# Patient Record
Sex: Male | Born: 1985 | Race: White | Hispanic: No | Marital: Single | Smoking: Former smoker
Health system: Southern US, Community
[De-identification: ages and names within clinical notes are randomized; demographics above are authoritative.]

## PROBLEM LIST (undated history)

## (undated) DIAGNOSIS — G4730 Sleep apnea, unspecified: Secondary | ICD-10-CM

## (undated) DIAGNOSIS — R7989 Other specified abnormal findings of blood chemistry: Secondary | ICD-10-CM

## (undated) DIAGNOSIS — I10 Essential (primary) hypertension: Secondary | ICD-10-CM

## (undated) DIAGNOSIS — Z8489 Family history of other specified conditions: Secondary | ICD-10-CM

## (undated) DIAGNOSIS — Z9189 Other specified personal risk factors, not elsewhere classified: Secondary | ICD-10-CM

## (undated) DIAGNOSIS — F419 Anxiety disorder, unspecified: Secondary | ICD-10-CM

## (undated) DIAGNOSIS — K219 Gastro-esophageal reflux disease without esophagitis: Secondary | ICD-10-CM

## (undated) DIAGNOSIS — E785 Hyperlipidemia, unspecified: Secondary | ICD-10-CM

## (undated) DIAGNOSIS — F32A Depression, unspecified: Secondary | ICD-10-CM

## (undated) DIAGNOSIS — R7303 Prediabetes: Secondary | ICD-10-CM

## (undated) HISTORY — DX: Morbid (severe) obesity due to excess calories: E66.01

## (undated) HISTORY — PX: TONSILLECTOMY AND ADENOIDECTOMY: SUR1326

## (undated) HISTORY — DX: Essential (primary) hypertension: I10

## (undated) HISTORY — DX: Hyperlipidemia, unspecified: E78.5

## (undated) HISTORY — DX: Anxiety disorder, unspecified: F41.9

## (undated) HISTORY — PX: WISDOM TOOTH EXTRACTION: SHX21

## (undated) HISTORY — DX: Gastro-esophageal reflux disease without esophagitis: K21.9

## (undated) HISTORY — DX: Depression, unspecified: F32.A

## (undated) HISTORY — DX: Other specified personal risk factors, not elsewhere classified: Z91.89

## (undated) HISTORY — DX: Other specified abnormal findings of blood chemistry: R79.89

---

## 2019-10-15 ENCOUNTER — Encounter: Payer: Self-pay | Admitting: Family Medicine

## 2019-10-15 ENCOUNTER — Ambulatory Visit (INDEPENDENT_AMBULATORY_CARE_PROVIDER_SITE_OTHER): Payer: PRIVATE HEALTH INSURANCE | Admitting: Family Medicine

## 2019-10-15 ENCOUNTER — Other Ambulatory Visit: Payer: Self-pay

## 2019-10-15 VITALS — BP 130/98 | HR 105 | Ht 69.0 in | Wt 319.6 lb

## 2019-10-15 DIAGNOSIS — F419 Anxiety disorder, unspecified: Secondary | ICD-10-CM

## 2019-10-15 DIAGNOSIS — R7989 Other specified abnormal findings of blood chemistry: Secondary | ICD-10-CM | POA: Diagnosis not present

## 2019-10-15 DIAGNOSIS — E785 Hyperlipidemia, unspecified: Secondary | ICD-10-CM

## 2019-10-15 DIAGNOSIS — F32A Depression, unspecified: Secondary | ICD-10-CM | POA: Insufficient documentation

## 2019-10-15 DIAGNOSIS — Z9189 Other specified personal risk factors, not elsewhere classified: Secondary | ICD-10-CM | POA: Insufficient documentation

## 2019-10-15 DIAGNOSIS — E7800 Pure hypercholesterolemia, unspecified: Secondary | ICD-10-CM | POA: Insufficient documentation

## 2019-10-15 DIAGNOSIS — R350 Frequency of micturition: Secondary | ICD-10-CM

## 2019-10-15 DIAGNOSIS — K219 Gastro-esophageal reflux disease without esophagitis: Secondary | ICD-10-CM | POA: Insufficient documentation

## 2019-10-15 DIAGNOSIS — E661 Drug-induced obesity: Secondary | ICD-10-CM | POA: Insufficient documentation

## 2019-10-15 DIAGNOSIS — I10 Essential (primary) hypertension: Secondary | ICD-10-CM | POA: Insufficient documentation

## 2019-10-15 DIAGNOSIS — Z833 Family history of diabetes mellitus: Secondary | ICD-10-CM

## 2019-10-15 DIAGNOSIS — F329 Major depressive disorder, single episode, unspecified: Secondary | ICD-10-CM

## 2019-10-15 DIAGNOSIS — R0683 Snoring: Secondary | ICD-10-CM

## 2019-10-15 LAB — POCT URINALYSIS DIP (PROADVANTAGE DEVICE)
Bilirubin, UA: NEGATIVE
Blood, UA: NEGATIVE
Glucose, UA: NEGATIVE mg/dL
Ketones, POC UA: NEGATIVE mg/dL
Leukocytes, UA: NEGATIVE
Nitrite, UA: NEGATIVE
Protein Ur, POC: NEGATIVE mg/dL
Specific Gravity, Urine: 1.025
Urobilinogen, Ur: NEGATIVE
pH, UA: 6 (ref 5.0–8.0)

## 2019-10-15 MED ORDER — LOSARTAN POTASSIUM-HCTZ 100-25 MG PO TABS: 1.0000 | ORAL_TABLET | Freq: Every day | ORAL | 2 refills | Status: DC

## 2019-10-15 MED ORDER — BUPROPION HCL ER (XL) 300 MG PO TB24: 300.0000 mg | ORAL_TABLET | Freq: Every day | ORAL | 2 refills | Status: DC

## 2019-10-15 MED ORDER — METOPROLOL TARTRATE 25 MG PO TABS: 25.0000 mg | ORAL_TABLET | Freq: Two times a day (BID) | ORAL | 2 refills | Status: DC

## 2019-10-15 MED ORDER — BUSPIRONE HCL 15 MG PO TABS: 7.5000 mg | ORAL_TABLET | Freq: Two times a day (BID) | ORAL | 2 refills | Status: DC

## 2019-10-15 MED ORDER — AMLODIPINE BESYLATE 10 MG PO TABS: 10.0000 mg | ORAL_TABLET | Freq: Every day | ORAL | 2 refills | Status: DC

## 2019-10-15 NOTE — Progress Notes (Signed)
Subjective:    Patient ID: Kurt Gomez, male    DOB: 03-14-1985, 34 y.o.   MRN: [id number]  HPI Chief Complaint  Patient presents with   new pt    new pt get estbalished. bp issues.    He is new to the practice and here to establish care.  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  Completed PhD   Single and no children.   HTN- diagnosed in 2015 and has been on 4 HTN medications recently. States his BP has still been elevated at home.  Taking Hyzaar 100-25, amlodipine 10 mg and metoprolol tartrate 25 mg States HTN runs in his family as well as diabetes.   He is concerned about his weight and is considering weight loss surgery with [facility]. States he needs help with weight loss.   Hx of elevated liver function tests.   Elevated lipids and states he was not started on a statin due to elevated liver tests.   Anxiety and depression- taking Wellbutrin and Buspar for the past year and half. States he is ready to see a therapist.   GERD- sleeps with his head elevated. Takes omeprazole daily   No alcohol.  Stopped smoking in July 2021. Smoked 1/2 ppd for 10 years.  No drug use.   Snores. States his mother thinks he may have sleep apnea. Wakes up gasping for air at times.  He feels rested but later in the day he feels tired.  Wakes up several times with nocturia.   Mother is a Engineer, civil (consulting).   Denies fever, chills, dizziness, chest pain, palpitations, shortness of breath, abdominal pain, N/V/D, or LE edema.   Reviewed allergies, medications, past medical, surgical, family, and social history.    Review of Systems Pertinent positives and negatives in the history of present illness.     Objective:   Physical Exam Constitutional:      General: He is not in acute distress.    Appearance: Normal appearance. He is not ill-appearing.  Cardiovascular:     Rate and Rhythm:  Normal rate and regular rhythm.     Pulses: Normal pulses.     Heart sounds: Normal heart sounds.  Pulmonary:     Effort: Pulmonary effort is normal.     Breath sounds: Normal breath sounds.  Musculoskeletal:     Cervical back: Normal range of motion and neck supple.  Skin:    General: Skin is warm and dry.     Capillary Refill: Capillary refill takes less than 2 seconds.  Neurological:     General: No focal deficit present.     Mental Status: He is alert.     Coordination: Coordination normal.     Deep Tendon Reflexes: Reflexes normal.  Psychiatric:        Mood and Affect: Mood normal.        Thought Content: Thought content normal.        Judgment: Judgment normal.    BP (!) 130/98    Pulse (!) 105    Ht 5\' 9"  (1.753 m)    Wt (!) 319 lb 9.6 oz (145 kg)    SpO2 98%    BMI 47.20 kg/m       Assessment & Plan:  Essential hypertension - Plan: Amb Ref to Medical Weight Management, CBC with Differential/Platelet, Comprehensive metabolic panel, EKG 12-Lead -He is a pleasant 34 year old male who is  new to the practice and here to establish care. Diagnosed with hypertension approximately 6 years ago and blood pressure is reportedly never been under very good control.  He is currently on 4 different hypertensive medications.  Counseling on healthy diet, specifically low-sodium diet. DASH diet handout provided. Discussed how weight loss could help with lowering BP. Discussed that he may have sleep apnea and if so, treating this can lower BP.  I will refer him to [facility] weight management for help with weight loss and order a HST.  He will follow up with me in 4 weeks and bring in his BP machine and readings.  UA shows trace protein. Recheck at his follow up visit.   Elevated liver function tests - Plan: Amb Ref to Medical Weight Management -obesity contributing. Follow up pending LFTs   Hyperlipidemia, unspecified hyperlipidemia type - Plan: Amb Ref to Medical Weight Management, Lipid  panel, EKG 12-Lead -history of HL and has not been on statin therapy per patient due to elevated LFTs  Anxiety and depression - Plan: TSH, T4, free, T3 -reports doing well on current treatment regimen. Recommend counseling. I am happy to provide him with a list of therapists if he would like   At risk for sleep apnea - Plan: Home sleep test -suspect he has sleep apnea and I will order a HST. Discussed health consequences of untreated OSA if he does have this.   Morbid obesity (HCC) - Plan: Amb Ref to Medical Weight Management, TSH, T4, free, T3, Hemoglobin A1c -he is motivated to lose weight. I will refer him to [facility]. Check labs and follow up.   Gastroesophageal reflux disease, unspecified whether esophagitis present -continue PPI and lifestyle modification   Urinary frequency - Plan: POCT Urinalysis DIP (Proadvantage Device) -UA neg for infection or glucose  Family history of diabetes mellitus in father - Plan: Hemoglobin A1c -screen for DM and follow up  Snoring - Plan: Home sleep test

## 2019-10-15 NOTE — Patient Instructions (Addendum)
You will hear from [facility] regarding you home sleep test to screen for sleep apnea.   You will also hear from [facility] to schedule a visit.   I will be in touch with your lab results.   Keep a close eye on your blood pressure at home. Goal BP readings are <130/80.  Cut back on sodium intake.   Return in 4 -6 weeks with your BP machine and readings.    DASH Eating Plan DASH stands for "Dietary Approaches to Stop Hypertension." The DASH eating plan is a healthy eating plan that has been shown to reduce high blood pressure (hypertension). It may also reduce your risk for type 2 diabetes, heart disease, and stroke. The DASH eating plan may also help with weight loss. What are tips for following this plan?  General guidelines  Avoid eating more than 2,300 mg (milligrams) of salt (sodium) a day. If you have hypertension, you may need to reduce your sodium intake to 1,500 mg a day.  Limit alcohol intake to no more than 1 drink a day for nonpregnant women and 2 drinks a day for men. One drink equals 12 oz of beer, 5 oz of wine, or 1 oz of hard liquor.  Work with your health care provider to maintain a healthy body weight or to lose weight. Ask what an ideal weight is for you.  Get at least 30 minutes of exercise that causes your heart to beat faster (aerobic exercise) most days of the week. Activities may include walking, swimming, or biking.  Work with your health care provider or diet and nutrition specialist (dietitian) to adjust your eating plan to your individual calorie needs. Reading food labels   Check food labels for the amount of sodium per serving. Choose foods with less than 5 percent of the Daily Value of sodium. Generally, foods with less than 300 mg of sodium per serving fit into this eating plan.  To find whole grains, look for the word "whole" as the first word in the ingredient list. Shopping  Buy products labeled as "low-sodium" or  "no salt added."  Buy fresh foods. Avoid canned foods and premade or frozen meals. Cooking  Avoid adding salt when cooking. Use salt-free seasonings or herbs instead of table salt or sea salt. Check with your health care provider or pharmacist before using salt substitutes.  Do not fry foods. Pierre foods using healthy methods such as baking, boiling, grilling, and broiling instead.  Rupert with heart-healthy oils, such as olive, canola, soybean, or sunflower oil. Meal planning  Eat a balanced diet that includes: ? 5 or more servings of fruits and vegetables each day. At each meal, try to fill half of your plate with fruits and vegetables. ? Up to 6-8 servings of whole grains each day. ? Less than 6 oz of lean meat, poultry, or fish each day. A 3-oz serving of meat is about the same size as a deck of cards. One egg equals 1 oz. ? 2 servings of low-fat dairy each day. ? A serving of nuts, seeds, or beans 5 times each week. ? Heart-healthy fats. Healthy fats called Omega-3 fatty acids are found in foods such as flaxseeds and coldwater fish, like sardines, salmon, and mackerel.  Limit how much you eat of the following: ? Canned or prepackaged foods. ? Food that is high in trans fat, such as fried foods. ? Food that is high in saturated fat, such as fatty meat. ?  Sweets, desserts, sugary drinks, and other foods with added sugar. ? Full-fat dairy products.  Do not salt foods before eating.  Try to eat at least 2 vegetarian meals each week.  Eat more home-cooked food and less restaurant, buffet, and fast food.  When eating at a restaurant, ask that your food be prepared with less salt or no salt, if possible. What foods are recommended? The items listed may not be a complete list. Talk with your dietitian about what dietary choices are best for you. Grains Whole-grain or whole-wheat bread. Whole-grain or whole-wheat pasta. Brown rice. [place]. Bulgur. Whole-grain and low-sodium  cereals. Pita bread. Low-fat, low-sodium crackers. Whole-wheat flour tortillas. Vegetables Fresh or frozen vegetables (raw, steamed, roasted, or grilled). Low-sodium or reduced-sodium tomato and vegetable juice. Low-sodium or reduced-sodium tomato sauce and tomato paste. Low-sodium or reduced-sodium canned vegetables. Fruits All fresh, dried, or frozen fruit. Canned fruit in natural juice (without added sugar). Meat and other protein foods Skinless chicken or [place]. Ground chicken or [place]. Pork with fat trimmed off. Fish and seafood. Egg whites. Dried beans, peas, or lentils. Unsalted nuts, nut butters, and seeds. Unsalted canned beans. Lean cuts of beef with fat trimmed off. Low-sodium, lean deli meat. Dairy Low-fat (1%) or fat-free (skim) milk. Fat-free, low-fat, or reduced-fat cheeses. Nonfat, low-sodium ricotta or cottage cheese. Low-fat or nonfat yogurt. Low-fat, low-sodium cheese. Fats and oils Soft margarine without trans fats. Vegetable oil. Low-fat, reduced-fat, or light mayonnaise and salad dressings (reduced-sodium). Canola, safflower, olive, soybean, and sunflower oils. Avocado. Seasoning and other foods Herbs. Spices. Seasoning mixes without salt. Unsalted popcorn and pretzels. Fat-free sweets. What foods are not recommended? The items listed may not be a complete list. Talk with your dietitian about what dietary choices are best for you. Grains Baked goods made with fat, such as croissants, muffins, or some breads. Dry pasta or rice meal packs. Vegetables Creamed or fried vegetables. Vegetables in a cheese sauce. Regular canned vegetables (not low-sodium or reduced-sodium). Regular canned tomato sauce and paste (not low-sodium or reduced-sodium). Regular tomato and vegetable juice (not low-sodium or reduced-sodium). Angie Fava. Olives. Fruits Canned fruit in a light or heavy syrup. Fried fruit. Fruit in cream or butter sauce. Meat and other protein foods Fatty cuts of meat. Ribs.  Fried meat. Berniece Salines. Sausage. Bologna and other processed lunch meats. Salami. Fatback. Hotdogs. Bratwurst. Salted nuts and seeds. Canned beans with added salt. Canned or smoked fish. Whole eggs or egg yolks. Chicken or [place] with skin. Dairy Whole or 2% milk, cream, and half-and-half. Whole or full-fat cream cheese. Whole-fat or sweetened yogurt. Full-fat cheese. Nondairy creamers. Whipped toppings. Processed cheese and cheese spreads. Fats and oils Butter. Stick margarine. Lard. Shortening. Ghee. Bacon fat. Tropical oils, such as coconut, palm kernel, or palm oil. Seasoning and other foods Salted popcorn and pretzels. Onion salt, garlic salt, seasoned salt, table salt, and sea salt. Worcestershire sauce. Tartar sauce. Barbecue sauce. Teriyaki sauce. Soy sauce, including reduced-sodium. Steak sauce. Canned and packaged gravies. Fish sauce. Oyster sauce. Cocktail sauce. Horseradish that you find on the shelf. Ketchup. Mustard. Meat flavorings and tenderizers. Bouillon cubes. Hot sauce and Tabasco sauce. Premade or packaged marinades. Premade or packaged taco seasonings. Relishes. Regular salad dressings. Where to find more information:  National Heart, Lung, and [place]: https://wilson-eaton.com/  American Heart Association: www.heart.org Summary  The DASH eating plan is a healthy eating plan that has been shown to reduce high blood pressure (hypertension). It may also reduce your risk for type 2 diabetes,  heart disease, and stroke.  With the DASH eating plan, you should limit salt (sodium) intake to 2,300 mg a day. If you have hypertension, you may need to reduce your sodium intake to 1,500 mg a day.  When on the DASH eating plan, aim to eat more fresh fruits and vegetables, whole grains, lean proteins, low-fat dairy, and heart-healthy fats.  Work with your health care provider or diet and nutrition specialist (dietitian) to adjust your eating plan to your individual calorie needs. This  information is not intended to replace advice given to you by your health care provider. Make sure you discuss any questions you have with your health care provider. Document Revised: 12/30/2016 Document Reviewed: 01/11/2016 Elsevier Patient Education  2020 [employer].

## 2019-10-16 LAB — COMPREHENSIVE METABOLIC PANEL
ALT: 56 IU/L — ABNORMAL HIGH (ref 0–44)
AST: 25 IU/L (ref 0–40)
Albumin/Globulin Ratio: 1.4 (ref 1.2–2.2)
Albumin: 4.5 g/dL (ref 4.0–5.0)
Alkaline Phosphatase: 106 IU/L (ref 44–121)
BUN/Creatinine Ratio: 10 (ref 9–20)
BUN: 13 mg/dL (ref 6–20)
Bilirubin Total: <0.2 mg/dL (ref 0.0–1.2)
CO2: 27 mmol/L (ref 20–29)
Calcium: 10.4 mg/dL — ABNORMAL HIGH (ref 8.7–10.2)
Chloride: 99 mmol/L (ref 96–106)
Creatinine, Ser: 1.28 mg/dL — ABNORMAL HIGH (ref 0.76–1.27)
GFR calc Af Amer: 86 mL/min/{1.73_m2} (ref >59)
GFR calc non Af Amer: 74 mL/min/{1.73_m2} (ref >59)
Globulin, Total: 3.3 g/dL (ref 1.5–4.5)
Glucose: 99 mg/dL (ref 65–99)
Potassium: 4.1 mmol/L (ref 3.5–5.2)
Sodium: 140 mmol/L (ref 134–144)
Total Protein: 7.8 g/dL (ref 6.0–8.5)

## 2019-10-16 LAB — TSH: TSH: 2.4 u[IU]/mL (ref 0.450–4.500)

## 2019-10-16 LAB — CBC WITH DIFFERENTIAL/PLATELET
Basophils Absolute: 0.1 10*3/uL (ref 0.0–0.2)
Basos: 1 %
EOS (ABSOLUTE): 0.2 10*3/uL (ref 0.0–0.4)
Eos: 2 %
Hematocrit: 44.9 % (ref 37.5–51.0)
Hemoglobin: 14.8 g/dL (ref 13.0–17.7)
Immature Grans (Abs): 0 10*3/uL (ref 0.0–0.1)
Immature Granulocytes: 0 %
Lymphocytes Absolute: 2.9 10*3/uL (ref 0.7–3.1)
Lymphs: 32 %
MCH: 26.8 pg (ref 26.6–33.0)
MCHC: 33 g/dL (ref 31.5–35.7)
MCV: 81 fL (ref 79–97)
Monocytes Absolute: 0.9 10*3/uL (ref 0.1–0.9)
Monocytes: 10 %
Neutrophils Absolute: 5 10*3/uL (ref 1.4–7.0)
Neutrophils: 55 %
Platelets: 456 10*3/uL — ABNORMAL HIGH (ref 150–450)
RBC: 5.53 x10E6/uL (ref 4.14–5.80)
RDW: 14.5 % (ref 11.6–15.4)
WBC: 9 10*3/uL (ref 3.4–10.8)

## 2019-10-16 LAB — HEMOGLOBIN A1C
Est. average glucose Bld gHb Est-mCnc: 117 mg/dL
Hgb A1c MFr Bld: 5.7 % — ABNORMAL HIGH (ref 4.8–5.6)

## 2019-10-16 LAB — LIPID PANEL
Chol/HDL Ratio: 5.7 ratio — ABNORMAL HIGH (ref 0.0–5.0)
Cholesterol, Total: 229 mg/dL — ABNORMAL HIGH (ref 100–199)
HDL: 40 mg/dL (ref >39)
LDL Chol Calc (NIH): 138 mg/dL — ABNORMAL HIGH (ref 0–99)
Triglycerides: 285 mg/dL — ABNORMAL HIGH (ref 0–149)
VLDL Cholesterol Cal: 51 mg/dL — ABNORMAL HIGH (ref 5–40)

## 2019-10-16 LAB — T4, FREE: Free T4: 1.21 ng/dL (ref 0.82–1.77)

## 2019-10-16 LAB — T3: T3, Total: 138 ng/dL (ref 71–180)

## 2019-10-16 NOTE — Progress Notes (Signed)
Please ask maria to add an acute hepatitis panel due to elevated ALT.  Let him know we are checking this panel due to a mildly elevated liver enzyme. Also, he does have prediabetes with a Hgb A1c of 5.7% (prediabetes range is 5.7-6.4% and diabetes starts at 6.5%). his LDL or bad cholesterol is elevated at 138 and goal range is less than 100. I recommend cutting back on fried foods, fatty foods and increasing his physical activity as tolerated.

## 2019-10-17 LAB — SPECIMEN STATUS REPORT

## 2019-10-17 LAB — HEPATITIS PANEL, ACUTE
Hep A IgM: NEGATIVE
Hep B C IgM: NEGATIVE
Hep C Virus Ab: <0.1 s/co ratio (ref 0.0–0.9)
Hepatitis B Surface Ag: NEGATIVE

## 2019-10-21 ENCOUNTER — Encounter: Payer: Self-pay | Admitting: Family Medicine

## 2019-11-07 ENCOUNTER — Telehealth: Payer: Self-pay | Admitting: Family Medicine

## 2019-11-07 NOTE — Telephone Encounter (Signed)
Received requested records from [facility]

## 2019-11-11 ENCOUNTER — Other Ambulatory Visit: Payer: Self-pay | Admitting: Surgery

## 2019-11-11 ENCOUNTER — Other Ambulatory Visit (HOSPITAL_COMMUNITY): Payer: Self-pay | Admitting: Surgery

## 2019-11-14 ENCOUNTER — Other Ambulatory Visit: Payer: Self-pay

## 2019-11-14 ENCOUNTER — Ambulatory Visit (INDEPENDENT_AMBULATORY_CARE_PROVIDER_SITE_OTHER): Payer: PRIVATE HEALTH INSURANCE | Admitting: Family Medicine

## 2019-11-14 ENCOUNTER — Encounter: Payer: Self-pay | Admitting: Family Medicine

## 2019-11-14 VITALS — BP 138/90 | HR 96 | Ht 70.0 in | Wt 319.8 lb

## 2019-11-14 DIAGNOSIS — R7989 Other specified abnormal findings of blood chemistry: Secondary | ICD-10-CM

## 2019-11-14 DIAGNOSIS — N189 Chronic kidney disease, unspecified: Secondary | ICD-10-CM

## 2019-11-14 DIAGNOSIS — E785 Hyperlipidemia, unspecified: Secondary | ICD-10-CM

## 2019-11-14 DIAGNOSIS — Z9189 Other specified personal risk factors, not elsewhere classified: Secondary | ICD-10-CM

## 2019-11-14 DIAGNOSIS — F419 Anxiety disorder, unspecified: Secondary | ICD-10-CM

## 2019-11-14 DIAGNOSIS — I10 Essential (primary) hypertension: Secondary | ICD-10-CM

## 2019-11-14 DIAGNOSIS — F32A Depression, unspecified: Secondary | ICD-10-CM

## 2019-11-14 DIAGNOSIS — R7303 Prediabetes: Secondary | ICD-10-CM | POA: Insufficient documentation

## 2019-11-14 HISTORY — DX: Essential (primary) hypertension: I10

## 2019-11-14 NOTE — Progress Notes (Signed)
Established patient visit   Patient: Kurt Gomez   DOB: [birth year]   33 y.o. Male  MRN: [id number] Visit Date: 11/14/2019  Today's healthcare provider: Hetty Blend, NP-C   Chief Complaint  Patient presents with   Hypertension   Anxiety   Depression   Subjective    HPI  Hypertension, follow-up  BP Readings from Last 3 Encounters:  11/14/19 138/90  10/15/19 (!) 130/98   Wt Readings from Last 3 Encounters:  11/14/19 (!) 319 lb 12.8 oz (145.1 kg)  10/15/19 (!) 319 lb 9.6 oz (145 kg)     He was last seen for hypertension 1 months ago.  BP at that visit was 130/98. Management since that visit includes continue current medication.  Review of records from previous medical records show that his BP has been uncontrolled as far back as 2017.   He reports good compliance with treatment. He is not having side effects.  He is following a Regular diet. He is not exercising. He does not smoke.  Outside blood pressures are arranging around 130's/90's.  Suspect he has sleep apnea but he has not yet heard from the sleep center. We will call and try to get this expedited.   He has paperwork from [facility] that he would like me to fill out. He is attempting to become eligible for weight loss surgery. He needs me to write a letter of necessity.    Symptoms: No chest pain No chest pressure  No palpitations No syncope  No dyspnea No orthopnea  No paroxysmal nocturnal dyspnea No lower extremity edema   Pertinent labs: Lab Results  Component Value Date   CHOL 229 (H) 10/15/2019   HDL 40 10/15/2019   LDLCALC 138 (H) 10/15/2019   TRIG 285 (H) 10/15/2019   CHOLHDL 5.7 (H) 10/15/2019   Lab Results  Component Value Date   NA 140 10/15/2019   K 4.1 10/15/2019   CREATININE 1.28 (H) 10/15/2019   GFRNONAA 74 10/15/2019   GFRAA 86 10/15/2019   GLUCOSE 99 10/15/2019     The ASCVD Risk score (Goff DC Jr., et al., 2013) failed to calculate for the following reasons:   The 2013  ASCVD risk score is only valid for ages 67 to 84   ---------------------------------------------------------------------------------------------------  Anxiety and Depression, Follow-up  He was last seen for anxiety and depression 1 months ago. Changes made at last visit include continue current medication.   He reports good compliance with treatment. He reports good tolerance of treatment. He is not having side effects.   He feels his anxiety and depression is mild and Unchanged since last visit.  Symptoms: No chest pain Yes difficulty concentrating  No dizziness No fatigue  No feelings of losing control Yes insomnia  No irritable No palpitations  No panic attacks No racing thoughts  No shortness of breath No sweating  No tremors/shakes    GAD-7 Results GAD-7 Generalized Anxiety Disorder Screening Tool 11/14/2019  1. Feeling Nervous, Anxious, or on Edge 1  2. Not Being Able to Stop or Control Worrying 1  3. Worrying Too Much About Different Things 1  4. Trouble Relaxing 2  5. Being So Restless it's Hard To Sit Still 1  6. Becoming Easily Annoyed or Irritable 0  7. Feeling Afraid As If Something Awful Might Happen 0  Total GAD-7 Score 6  Difficulty At Work, Home, or Getting  Along With Others? Somewhat difficult    PHQ-9 Scores PHQ9 SCORE ONLY 10/15/2019  PHQ-9  Total Score 0    ---------------------------------------------------------------------------------------------------    Medications: Outpatient Medications Prior to Visit  Medication Sig   amLODipine (NORVASC) 10 MG tablet Take 1 tablet (10 mg total) by mouth daily.   buPROPion (WELLBUTRIN XL) 300 MG 24 hr tablet Take 1 tablet (300 mg total) by mouth daily.   busPIRone (BUSPAR) 15 MG tablet Take 0.5 tablets (7.5 mg total) by mouth 2 (two) times daily.   losartan-hydrochlorothiazide (HYZAAR) 100-25 MG tablet Take 1 tablet by mouth daily.   metoprolol tartrate (LOPRESSOR) 25 MG tablet Take 1 tablet (25 mg  total) by mouth 2 (two) times daily.   No facility-administered medications prior to visit.    Review of Systems Pertinent positives and negatives in the history of present illness.    Objective    BP 138/90 (BP Location: Right Arm, Patient Position: Sitting, Cuff Size: Normal)    Pulse 96    Ht 5\' 10"  (1.778 m)    Wt (!) 319 lb 12.8 oz (145.1 kg)    SpO2 97%    BMI 45.89 kg/m    Physical Exam Constitutional:      Appearance: Normal appearance. He is obese.  Skin:    General: Skin is warm and dry.  Neurological:     General: No focal deficit present.     Mental Status: He is alert and oriented to person, place, and time.  Psychiatric:        Mood and Affect: Mood normal.        Behavior: Behavior normal.      No results found for any visits on 11/14/19.  Assessment & Plan     Uncontrolled hypertension - Plan: Comprehensive metabolic panel, Ambulatory referral to Cardiology  Anxiety and depression  Hyperlipidemia, unspecified hyperlipidemia type  Morbid obesity (HCC)  At risk for sleep apnea  Elevated liver function tests - Plan: Comprehensive metabolic panel  Chronic kidney disease, unspecified CKD stage - Plan: Comprehensive metabolic panel  Prediabetes  This is his 2nd visit with me. I received records from his previous PCP in 11/16/19. He has had uncontrolled HTN since 2017 and has been on 4 HTN agents.  Suspect he has OSA and I have ordered a HST. Awaiting to be contacted to do this. I will cancel his current referral since he has not heard in one month and switch the referral to [facility]. Discussed this may be contributing to his HTN Plan to refer him to cardiology for further assistance with resistant HTN. Dr. [facility] agrees with plan.  He has been struggling with his weight for years. We reviewed his weights all the way back to 2017 as well as the methods for weight loss. We documented all of this for his application for [facility].  Reviewed abnormal labs from previous  visit including HL, prediabetes, elevated ALTand CKD.  Recheck labs and follow up.  Work on letter of necessity for [facility] and return to him.    2018, NP-C  [facility] [phone] (phone) [phone] (fax)  [facility]

## 2019-11-15 ENCOUNTER — Encounter: Payer: Self-pay | Admitting: Internal Medicine

## 2019-11-15 LAB — COMPREHENSIVE METABOLIC PANEL
ALT: 47 IU/L — ABNORMAL HIGH (ref 0–44)
AST: 28 IU/L (ref 0–40)
Albumin/Globulin Ratio: 1.6 (ref 1.2–2.2)
Albumin: 4.6 g/dL (ref 4.0–5.0)
Alkaline Phosphatase: 103 IU/L (ref 44–121)
BUN/Creatinine Ratio: 14 (ref 9–20)
BUN: 15 mg/dL (ref 6–20)
Bilirubin Total: <0.2 mg/dL (ref 0.0–1.2)
CO2: 24 mmol/L (ref 20–29)
Calcium: 10 mg/dL (ref 8.7–10.2)
Chloride: 99 mmol/L (ref 96–106)
Creatinine, Ser: 1.09 mg/dL (ref 0.76–1.27)
GFR calc Af Amer: 104 mL/min/{1.73_m2} (ref >59)
GFR calc non Af Amer: 90 mL/min/{1.73_m2} (ref >59)
Globulin, Total: 2.9 g/dL (ref 1.5–4.5)
Glucose: 104 mg/dL — ABNORMAL HIGH (ref 65–99)
Potassium: 4.4 mmol/L (ref 3.5–5.2)
Sodium: 141 mmol/L (ref 134–144)
Total Protein: 7.5 g/dL (ref 6.0–8.5)

## 2019-12-02 ENCOUNTER — Encounter: Payer: Self-pay | Admitting: Family Medicine

## 2019-12-02 ENCOUNTER — Telehealth: Payer: Self-pay | Admitting: Internal Medicine

## 2019-12-02 ENCOUNTER — Other Ambulatory Visit: Payer: Self-pay | Admitting: Internal Medicine

## 2019-12-02 DIAGNOSIS — G4733 Obstructive sleep apnea (adult) (pediatric): Secondary | ICD-10-CM

## 2019-12-02 NOTE — Telephone Encounter (Signed)
Pt has severe sleep apnea, evidence of significant oxygen desaturation. I have put in order for cpap machine to [facility]. Left message for pt to call me back

## 2019-12-03 ENCOUNTER — Other Ambulatory Visit: Payer: Self-pay

## 2019-12-03 ENCOUNTER — Ambulatory Visit (HOSPITAL_COMMUNITY)
Admission: RE | Admit: 2019-12-03 | Discharge: 2019-12-03 | Disposition: A | Discharge status: Home / Self Care | Payer: PRIVATE HEALTH INSURANCE | Source: Ambulatory Visit | Attending: Surgery | Admitting: Surgery

## 2019-12-05 ENCOUNTER — Other Ambulatory Visit: Payer: Self-pay

## 2019-12-05 ENCOUNTER — Encounter: Payer: Self-pay | Admitting: Dietician

## 2019-12-05 ENCOUNTER — Encounter: Payer: PRIVATE HEALTH INSURANCE | Attending: Surgery | Admitting: Dietician

## 2019-12-05 DIAGNOSIS — E669 Obesity, unspecified: Secondary | ICD-10-CM | POA: Diagnosis not present

## 2019-12-05 NOTE — Progress Notes (Signed)
Nutrition Assessment for Bariatric Surgery Medical Nutrition Therapy   Patient was seen on 12/05/2019 for Pre-Operative Nutrition Assessment. Letter of approval faxed to [facility] bariatric surgery program coordinator on 12/05/2019.   Referral stated Supervised Weight Loss (SWL) visits needed: 6?* *this is patient reported, neither the patient or [facility] have been directly told the patient's SWL visit requirements  Planned surgery: Sleeve Pt expectation of surgery: to lose weight   NUTRITION ASSESSMENT   Anthropometrics  Start weight at [facility]: 318.4 lbs (date: 12/05/2019) Height: 69.5 in BMI: 46.4 kg/m2     Lifestyle & Dietary Hx Patient works as a professor at [employer]. Moved here from [place], currently lives alone. Eats out throughout the week, 5-10 meals. Typical meal pattern is 2 meals per day (breakfast and dinner) and snacking in between. May have noodles with sauce and meat, likes biscuits and toast.  May snack on chips, a granola bar, a muffin, pretzels, or nuts.   Any previous deficiencies: no  Micronutrient Nutrition Focused Physical Exam: Hair: no issues observed Eyes: no issues observed Mouth: no issues observed Neck: no issues observed Nails: no issues observed Skin: no issues observed  24-Hr Dietary Recall First Meal: 3-4 egg ham & cheese omelet Snack: - Second Meal: - Snack: pretzels Third Meal: chicken + dressing Snack: bowl of cerel + milk  Beverages: coffee, water   NUTRITION DIAGNOSIS  Overweight/obesity ([place]-3.3) related to past poor dietary habits and physical inactivity as evidenced by patient w/ planned Sleeve surgery following dietary guidelines for continued weight loss.    NUTRITION INTERVENTION  Nutrition counseling (C-1) and education (E-2) to facilitate bariatric surgery goals.  Pre-Op Goals Reviewed with the Patient . Track food and beverage intake (pen and paper, MyFitness Pal, Baritastic app, etc.) . Make healthy food choices  while monitoring portion sizes . Consume 3 meals per day or try to eat every 3-5 hours . Avoid concentrated sugars and fried foods . Keep sugar & fat in the single digits per serving on food labels . Practice CHEWING your food (aim for applesauce consistency) . Practice not drinking 15 minutes before, during, and 30 minutes after each meal and snack . Avoid all carbonated beverages (ex: soda, sparkling beverages)  . Limit caffeinated beverages (ex: coffee, tea, energy drinks) . Avoid all sugar-sweetened beverages (ex: regular soda, sports drinks)  . Avoid alcohol  . Aim for 64-100 ounces of FLUID daily (with at least half of fluid intake being plain water)  . Aim for at least 60-80 grams of PROTEIN daily . Look for a liquid protein source that contains ?15 g protein and ?5 g carbohydrate (ex: shakes, drinks, shots) . Make a list of non-food related activities . Physical activity is an important part of a healthy lifestyle so keep it moving! The goal is to reach 150 minutes of exercise per week, including cardiovascular and weight baring activity.  *Goals that are bolded indicate the pt would like to start working towards these  Handouts Provided Include  . Bariatric Surgery handouts (Nutrition Visits, Pre-Op Goals, Protein Shakes)  Learning Style & Readiness for Change Teaching method utilized: Visual & Auditory  Demonstrated degree of understanding via: Teach Back  Barriers to learning/adherence to lifestyle change: None Identified    MONITORING & EVALUATION Dietary intake, weekly physical activity, body weight, and pre-op goals reached at next nutrition visit.   Next Steps Patient is to return to [facility] for 1st SWL Visit unless we learn he does not have any SWL requirements. Otherwise  we will have patient return for the Pre-Op Class 2 weeks prior to surgery.

## 2019-12-08 NOTE — Progress Notes (Signed)
Cardiology Office Note:    Date:  12/10/2019   ID:  Kurt Gomez, DOB 21-Apr-1985, MRN [id number]  PCP:  Avanell Shackleton, NP-C  [facility] Cardiologist:  No primary care provider on file.  [facility] Electrophysiologist:  None   Referring MD: Avanell Shackleton, NP-C    History of Present Illness:    Kurt Gomez is a 34 y.o. male with a hx of CKD, HTN, morbid obesity, and HLD who was referred by Hetty Blend, NP-C for further evaluation of HTN.  Patient was initially diagnosed with HTN in 2017. Has been poorly controlled despite use of 4 antihypertensive agents. Was previously followed in [place] where he underwent renal ultrasound which was reportedly normal. Not sure if he had other work-up of his hypertension but believes it was considered genetic as everyone in his family has elevated BP.  Overall, the patient feels well. Has heart burn and GI issues but no chest pain, SOB, orthopnea, or LE edema. Had episodes of apnea and had sleep study done and patient has severe OSA now ordered for CPAP. Hoping to undergo bariatric surgery for obesity.   Blood pressure at home 130s/90s at home on amlodipine 10mg , losartan-HTCZ 100-25mg , metop 25mg  BID. No lightheadedness, dizziness, or syncope.  Family history: mother-HTN, father-HTN, aunts and uncles-HTN (all diagnosed early). Uncles with CAD and MI.   Past Medical History:  Diagnosis Date  . Anxiety and depression   . At risk for sleep apnea   . Elevated liver function tests   . GERD (gastroesophageal reflux disease)   . HTN (hypertension)   . Hyperlipidemia   . Morbid obesity (HCC)     Past Surgical History:  Procedure Laterality Date  . TONSILLECTOMY AND ADENOIDECTOMY      Current Medications: Current Meds  Medication Sig  . amLODipine (NORVASC) 10 MG tablet Take 1 tablet (10 mg total) by mouth daily.  buPROPion (WELLBUTRIN XL) 300 MG 24 hr tablet Take 1 tablet (300 mg total) by mouth daily.  . busPIRone (BUSPAR) 15 MG  tablet Take 0.5 tablets (7.5 mg total) by mouth 2 (two) times daily.  losartan-hydrochlorothiazide (HYZAAR) 100-25 MG tablet Take 1 tablet by mouth daily.  . [DISCONTINUED] metoprolol tartrate (LOPRESSOR) 25 MG tablet Take 1 tablet (25 mg total) by mouth 2 (two) times daily.     Allergies:   Patient has no known allergies.   Social History   Socioeconomic History  . Marital status: Single    Spouse name: Not on file  . Number of children: Not on file  . Years of education: Not on file  . Highest education level: Not on file  Occupational History  . Not on file  Tobacco Use  . Smoking status: Former Smoker    Packs/day: 0.50    Years: 10.00    Pack years: 5.00    Quit date: 08/15/2019    Years since quitting: 0.3  . Smokeless tobacco: Never Used  Substance and Sexual Activity  . Alcohol use: Never  . Drug use: Never  . Sexual activity: Not Currently  Other Topics Concern  . Not on file  Social History Narrative  . Not on file   Social Determinants of Health   Financial Resource Strain:   . Difficulty of Paying Living Expenses: Not on file  Food Insecurity: No Food Insecurity  . Worried About Marland Kitchen in the Last Year: Never true  . Ran Out of Food in the Last Year: Never true  Transportation Needs:   . Freight forwarder (Medical): Not on file  . Lack of Transportation (Non-Medical): Not on file  Physical Activity:   . Days of Exercise per Week: Not on file  . Minutes of Exercise per Session: Not on file  Stress:   . Feeling of Stress : Not on file  Social Connections:   . Frequency of Communication with Friends and Family: Not on file  . Frequency of Social Gatherings with Friends and Family: Not on file  . Attends Religious Services: Not on file  . Active Member of Clubs or Organizations: Not on file  . Attends Banker Meetings: Not on file  . Marital Status: Not on file     Family History: The patient's family history includes  Depression in his mother; Diabetes in his father; Hyperlipidemia in his father; Hypertension in his father.  ROS:   Please see the history of present illness.    Review of Systems  Constitutional: Negative for chills and fever.  HENT: Negative for hearing loss.   Eyes: Negative for blurred vision.  Respiratory: Negative for cough and shortness of breath.   Cardiovascular: Negative for chest pain, palpitations, orthopnea, claudication and leg swelling.  Gastrointestinal: Positive for heartburn. Negative for blood in stool, nausea and vomiting.  Genitourinary: Negative for hematuria.  Musculoskeletal: Negative for myalgias.  Neurological: Negative for dizziness and loss of consciousness.  Psychiatric/Behavioral: Negative for substance abuse.    EKGs/Labs/Other Studies Reviewed:    The following studies were reviewed today: Sleep study pending  CXR 12/03/19: FINDINGS: The cardiomediastinal contours are normal. The lungs are clear. Pulmonary vasculature is normal. No consolidation, pleural effusion, or pneumothorax. No acute osseous abnormalities are seen.  EKG:  EKG 10/16/19: NSR with no ischemia or block  Recent Labs: 10/15/2019: Hemoglobin 14.8; Platelets 456; TSH 2.400 11/14/2019: ALT 47; BUN 15; Creatinine, Ser 1.09; Potassium 4.4; Sodium 141  Recent Lipid Panel    Component Value Date/Time   CHOL 229 (H) 10/15/2019 1555   TRIG 285 (H) 10/15/2019 1555   HDL 40 10/15/2019 1555   CHOLHDL 5.7 (H) 10/15/2019 1555   LDLCALC 138 (H) 10/15/2019 1555     Physical Exam:    VS:  BP (!) 144/86   Pulse 85   Ht 5\' 10"  (1.778 m)   Wt (!) 319 lb 12.8 oz (145.1 kg)   SpO2 99%   BMI 45.89 kg/m     Wt Readings from Last 3 Encounters:  12/10/19 (!) 319 lb 12.8 oz (145.1 kg)  12/05/19 (!) 318 lb 6.4 oz (144.4 kg)  11/14/19 (!) 319 lb 12.8 oz (145.1 kg)     GEN:  Well nourished, well developed in no acute distress HEENT: Normal NECK: No JVD; No carotid bruits CARDIAC: RRR, no  murmurs, rubs, gallops RESPIRATORY:  Clear to auscultation without rales, wheezing or rhonchi  ABDOMEN: Obese, soft, non-tender, non-distended MUSCULOSKELETAL:  No edema; No deformity  SKIN: Warm and dry NEUROLOGIC:  Alert and oriented x 3 PSYCHIATRIC:  Normal affect   ASSESSMENT:    1. Uncontrolled hypertension   2. Chronic kidney disease, unspecified CKD stage   3. Obstructive sleep apnea   4. Mixed hyperlipidemia   5. Morbid obesity (HCC)    PLAN:    In order of problems listed above:  #Resistant Hypertension: Diagnosed in 2017 in 2018. Has been difficult to control despite 4 anti-hypertensives. Believes he had a renal ultrasound in [place] which was reportedly normal but unsure if he had  any other testing for secondary causes of HTN. Has a very strong family history of elevated blood pressure in his family and believes his to be genetic. Bps at home running 130s/90s. -Suspect essential HTN however, given diagnosis in 20s and resistant nature, will check renin/aldo ratio to assess for possible secondary causes -Agree with treatment of OSA as likely contributing to difficult to control BPs -Continue amlodipine 10mg  daily -Continue losartan-HCTZ 100mg -25mg  daily -Stop metoprolol 25mg  BID -Start spironolactone 25mg  daily -Will give script for coreg 3.152mg  BID to take if needed if blood pressure remains elevated despite initiation of sprinolactone -Check BMET 1 week after starting spironolactone  #OSA: Recently diagnosed with severe OSA. Awaiting CPAP. -Plans to start CPAP; will help with blood pressure as well  #Morbid obesity with BMI 46: Planning to undergo bariatric surgery and currently undergoing work-up. -Follow-up with bariatric surgery center as scheduled  #HLD: LDL 138, HDL 40, TG 285. ASCVD risk low due to age. Will need continued monitoring. -Diet and lifestyle modifications -Continue weight loss efforts -Management per PCP  #Elevated LFTs: Likely due to fatty  liver. Manage weight loss as above. -Continue to trend per PCP  Shared Decision Making/Informed Consent      Medication Adjustments/Labs and Tests Ordered: Current medicines are reviewed at length with the patient today.  Concerns regarding medicines are outlined above.  Orders Placed This Encounter  Procedures  . Aldosterone + renin activity w/ ratio  . Basic metabolic panel   Meds ordered this encounter  Medications  . spironolactone (ALDACTONE) 25 MG tablet    Sig: Take 1 tablet (25 mg total) by mouth daily.    Dispense:  90 tablet    Refill:  3  . carvedilol (COREG) 3.125 MG tablet    Sig: Take 1 tablet (3.125 mg total) by mouth 2 (two) times daily as needed.    Dispense:  180 tablet    Refill:  1    Patient Instructions  Medication Instructions:  Your physician has recommended you make the following change in your medication:   STOP: Metoprolol START: Spironolactone 25mg  daily START: Carvedilol 3.125mg  twice daily as needed  *If you need a refill on your cardiac medications before your next appointment, please call your pharmacy*   Lab Work: 12/19/2019 BMET, RENIN ALDASTERONE  If you have labs (blood work) drawn today and your tests are completely normal, you will receive your results only by: MyChart Message (if you have MyChart) OR . A paper copy in the mail If you have any lab test that is abnormal or we need to change your treatment, we will call you to review the results.   Follow-Up: At [facility], you and your health needs are our priority.  As part of our continuing mission to provide you with exceptional heart care, we have created designated Provider Care Teams.  These Care Teams include your primary Cardiologist (physician) and Advanced Practice Providers (APPs -  Physician Assistants and Nurse Practitioners) who all work together to provide you with the care you need, when you need it.  We recommend signing up for the patient portal called  "MyChart".  Sign up information is provided on this After Visit Summary.  MyChart is used to connect with patients for Virtual Visits (Telemedicine).  Patients are able to view lab/test results, encounter notes, upcoming appointments, etc.  Non-urgent messages can be sent to your provider as well.   To learn more about what you can do with MyChart, go to .  Your next appointment:   6 month(s)  The format for your next appointment:   In Person  Provider:   You may see Laurance FlattenHeather Einar Nolasco, MD or one of the following Advanced Practice Providers on your designated Care Team:    Tereso NewcomerScott Weaver, PA-C  Chelsea AusVin Bhagat, [place] JerseyPA-C       Signed, Meriam SpragueHeather E Roshon Duell, MD  12/10/2019 10:43 AM    [facility]

## 2019-12-10 ENCOUNTER — Encounter: Payer: Self-pay | Admitting: Cardiology

## 2019-12-10 ENCOUNTER — Ambulatory Visit (INDEPENDENT_AMBULATORY_CARE_PROVIDER_SITE_OTHER): Payer: PRIVATE HEALTH INSURANCE | Admitting: Cardiology

## 2019-12-10 ENCOUNTER — Other Ambulatory Visit: Payer: Self-pay

## 2019-12-10 VITALS — BP 144/86 | HR 85 | Ht 70.0 in | Wt 319.8 lb

## 2019-12-10 DIAGNOSIS — N189 Chronic kidney disease, unspecified: Secondary | ICD-10-CM | POA: Diagnosis not present

## 2019-12-10 DIAGNOSIS — G4733 Obstructive sleep apnea (adult) (pediatric): Secondary | ICD-10-CM | POA: Diagnosis not present

## 2019-12-10 DIAGNOSIS — I10 Essential (primary) hypertension: Secondary | ICD-10-CM | POA: Diagnosis not present

## 2019-12-10 DIAGNOSIS — E782 Mixed hyperlipidemia: Secondary | ICD-10-CM

## 2019-12-10 MED ORDER — CARVEDILOL 3.125 MG PO TABS: 3.1250 mg | ORAL_TABLET | Freq: Two times a day (BID) | ORAL | 1 refills | Status: DC | PRN

## 2019-12-10 MED ORDER — SPIRONOLACTONE 25 MG PO TABS: 25.0000 mg | ORAL_TABLET | Freq: Every day | ORAL | 3 refills | Status: DC

## 2019-12-10 NOTE — Patient Instructions (Signed)
Medication Instructions:  Your physician has recommended you make the following change in your medication:   STOP: Metoprolol START: Spironolactone 25mg  daily START: Carvedilol 3.125mg  twice daily as needed  *If you need a refill on your cardiac medications before your next appointment, please call your pharmacy*   Lab Work: 12/19/2019 BMET, RENIN ALDASTERONE  If you have labs (blood work) drawn today and your tests are completely normal, you will receive your results only by:  MyChart Message (if you have MyChart) OR  A paper copy in the mail If you have any lab test that is abnormal or we need to change your treatment, we will call you to review the results.   Follow-Up: At [facility], you and your health needs are our priority.  As part of our continuing mission to provide you with exceptional heart care, we have created designated Provider Care Teams.  These Care Teams include your primary Cardiologist (physician) and Advanced Practice Providers (APPs -  Physician Assistants and Nurse Practitioners) who all work together to provide you with the care you need, when you need it.  We recommend signing up for the patient portal called "MyChart".  Sign up information is provided on this After Visit Summary.  MyChart is used to connect with patients for Virtual Visits (Telemedicine).  Patients are able to view lab/test results, encounter notes, upcoming appointments, etc.  Non-urgent messages can be sent to your provider as well.   To learn more about what you can do with MyChart, go to [facility].    Your next appointment:   6 month(s)  The format for your next appointment:   In Person  Provider:   You may see [email or web address], MD or one of the following Advanced Practice Providers on your designated Care Team:    Laurance Flatten, PA-C  Vin [place]view, [place]

## 2019-12-12 ENCOUNTER — Encounter: Payer: Self-pay | Admitting: Family Medicine

## 2019-12-19 ENCOUNTER — Other Ambulatory Visit: Payer: PRIVATE HEALTH INSURANCE | Admitting: *Deleted

## 2019-12-19 ENCOUNTER — Other Ambulatory Visit: Payer: Self-pay

## 2019-12-19 DIAGNOSIS — I10 Essential (primary) hypertension: Secondary | ICD-10-CM

## 2019-12-19 DIAGNOSIS — N189 Chronic kidney disease, unspecified: Secondary | ICD-10-CM

## 2019-12-24 ENCOUNTER — Ambulatory Visit: Payer: PRIVATE HEALTH INSURANCE | Admitting: Skilled Nursing Facility1

## 2019-12-25 ENCOUNTER — Other Ambulatory Visit: Payer: Self-pay

## 2019-12-25 ENCOUNTER — Encounter: Payer: PRIVATE HEALTH INSURANCE | Admitting: Skilled Nursing Facility1

## 2019-12-25 DIAGNOSIS — E669 Obesity, unspecified: Secondary | ICD-10-CM

## 2019-12-25 NOTE — Progress Notes (Signed)
Supervised Weight Loss Visit Bariatric Nutrition Education Appt Start Time: 8:25 End Time: 8:59 am  Planned Surgery: sleeve Pt Expectation of Surgery/ Goals: to lose weight  1 out of 6 SWL Appointments   Star given previously: no  NUTRITION ASSESSMENT  Anthropometrics  Start weight at [facility]: 318.4 lbs (date: 12/05/2019) Height: 69.5 in BMI: 46.4 kg/m2     Clinical  Medical Hx: HTN, GERD, CKD (not staged), predisbetes Medications: see list Labs: A1C 5.7 Notable Signs/Symptoms: reflux  Lifestyle & Dietary Hx  Patient works as a Programmer, multimedia at [employer]. Moved here from [place], currently lives alone. Eats out throughout the week, 5-10 meals. Typical meal pattern is 2 meals per day (breakfast and dinner) and snacking in between. May have noodles with sauce and meat, likes biscuits and toast.  May snack on chips, a granola bar, a muffin, pretzels, or nuts.    Pt states he really loves his creamer coffee. Pt states he usually has a bowl of cereal before bed. Pt states his diet is much better than it used to be which was all fast food and also stopped smoking.  Dietitian advised pt to avoid lunch meat due to heart condition.  Estimated daily fluid intake: 64+ oz Supplements: multi Current average weekly physical activity: ADL's  24-Hr Dietary Recall First Meal: ham and cheese 3 egg omelot Snack: Second Meal: skipped Snack: chips Third Meal: meatloaf and potatoes  Snack: cereal Beverages: 20-24 coffee + sweet cream, 64 ounces water  Estimated Energy Needs Calories: 1600  NUTRITION DIAGNOSIS  Overweight/obesity ([place]-3.3) related to past poor dietary habits and physical inactivity as evidenced by patient w/ planned sleeve surgery following dietary guidelines for continued weight loss.   NUTRITION INTERVENTION  Nutrition counseling (C-1) and education (E-2) to facilitate bariatric surgery goals.  Pre-Op Goals Progress & New Goals . Continue: Chew food well (difficult due to  having a texture issue) . Complete: Find a protein shake  . NEW: find a substute for your creamer: try flavored protein shake  . NEW: aim to eat lunch: example: wrap + cooked carrots in the microwave OR chicken + cucumbers + microwave brown rice   Handouts Provided Include   Detailed my plate  Learning Style & Readiness for Change Teaching method utilized: Visual & Auditory  Demonstrated degree of understanding via: Teach Back  Readiness Level: contemplative Barriers to learning/adherence to lifestyle change: work scheduled  RD's Notes for next Visit  . Assess pts adherence to chosen goals   MONITORING & EVALUATION Dietary intake, weekly physical activity, body weight, and pre-op goals in 1 month.   Next Steps  Patient is to return to [facility]

## 2019-12-31 LAB — BASIC METABOLIC PANEL
BUN/Creatinine Ratio: 12 (ref 9–20)
BUN: 12 mg/dL (ref 6–20)
CO2: 27 mmol/L (ref 20–29)
Calcium: 9.8 mg/dL (ref 8.7–10.2)
Chloride: 102 mmol/L (ref 96–106)
Creatinine, Ser: 1.04 mg/dL (ref 0.76–1.27)
GFR calc Af Amer: 109 mL/min/{1.73_m2} (ref >59)
GFR calc non Af Amer: 95 mL/min/{1.73_m2} (ref >59)
Glucose: 96 mg/dL (ref 65–99)
Potassium: 4.4 mmol/L (ref 3.5–5.2)
Sodium: 140 mmol/L (ref 134–144)

## 2019-12-31 LAB — ALDOSTERONE + RENIN ACTIVITY W/ RATIO
ALDOS/RENIN RATIO: 15.3 (ref 0.0–30.0)
ALDOSTERONE: 8.3 ng/dL (ref 0.0–30.0)
Renin: 0.544 ng/mL/hr (ref 0.167–5.380)

## 2020-01-04 ENCOUNTER — Other Ambulatory Visit: Payer: Self-pay | Admitting: Family Medicine

## 2020-01-14 ENCOUNTER — Other Ambulatory Visit: Payer: Self-pay | Admitting: Family Medicine

## 2020-01-14 MED ORDER — BUPROPION HCL ER (XL) 300 MG PO TB24: 300.0000 mg | ORAL_TABLET | Freq: Every day | ORAL | 0 refills | Status: DC

## 2020-01-14 MED ORDER — CARVEDILOL 6.25 MG PO TABS: 6.2500 mg | ORAL_TABLET | ORAL | 3 refills | Status: DC | PRN

## 2020-01-14 MED ORDER — AMLODIPINE BESYLATE 10 MG PO TABS: 10.0000 mg | ORAL_TABLET | Freq: Every day | ORAL | 2 refills | Status: DC

## 2020-01-14 MED ORDER — LOSARTAN POTASSIUM-HCTZ 100-25 MG PO TABS: 1.0000 | ORAL_TABLET | Freq: Every day | ORAL | 2 refills | Status: DC

## 2020-01-14 NOTE — Telephone Encounter (Signed)
Clarifying medications to order with Dr. Shari Prows.    "I am so glad you are paying attention!! We can refill the wellbutrin for a month just to get him through. No metoprolol but can we send coreg 6.25mg  (instead of 3.125mg ) BID. The others are good."    Called patient to verify pharmacy and make sure he understands not to take metoprolol and increase coreg. Advise patient to contact PCP for further Wellbutrin refills.  Patient verbalized understanding.

## 2020-02-04 ENCOUNTER — Ambulatory Visit: Payer: PRIVATE HEALTH INSURANCE | Admitting: Skilled Nursing Facility1

## 2020-02-05 ENCOUNTER — Encounter: Payer: PRIVATE HEALTH INSURANCE | Attending: Surgery | Admitting: Skilled Nursing Facility1

## 2020-02-05 ENCOUNTER — Other Ambulatory Visit: Payer: Self-pay

## 2020-02-05 DIAGNOSIS — N189 Chronic kidney disease, unspecified: Secondary | ICD-10-CM | POA: Insufficient documentation

## 2020-02-05 NOTE — Progress Notes (Signed)
Supervised Weight Loss Visit Bariatric Nutrition Education Appt Start Time: 8:25 End Time: 8:59 am  Planned Surgery: sleeve Pt Expectation of Surgery/ Goals: to lose weight  2 out of 6 SWL Appointments   Star given previously: no  NUTRITION ASSESSMENT  Anthropometrics  Start weight at [facility]: 318.4 lbs (date: 12/05/2019) Weight: 321. 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   Pt states he usually has a bowl of cereal before bed. Pt states his diet is much better than it used to be which was all fast food and also stopped smoking.   Pt state she tried sugar free creamer which was gross so tried sugar substitute and powder creamer which he likes. Pt states he was able to eat lunch a couple times but feels he could do better. Pt states he is mentally getting closer to understanding he does need to eat in the middle of the day seeing where he gest more energy longer lasting. Pt states he has stopped salting his foods.   Estimated daily fluid intake: 64+ oz Supplements: multi Current average weekly physical activity: ADL's  24-Hr Dietary Recall First Meal: ham and cheese 3 egg oemelot + coffee Snack: Second Meal: skipped or salad: ham, croutons, cheese, pickles, dressing, ice burg lettuce  Snack: chips Third Meal: meatloaf and potatoes or tacos: bell peppers, cheese dip, carb balance tortilla   Snack: life or honey bunches of oats cereal or ice cream Beverages: 20-24 coffee + xyletol and powder creamer, 64 ounces water  Estimated Energy Needs Calories: 1600  NUTRITION DIAGNOSIS  Overweight/obesity ([place]-3.3) related to past poor dietary habits and physical inactivity as evidenced by patient w/ planned sleeve surgery following  dietary guidelines for continued weight loss.   NUTRITION INTERVENTION  Nutrition counseling (C-1) and education (E-2) to facilitate bariatric surgery goals.  Pre-Op Goals Progress & New Goals . Continue: Chew food well (difficult due to having a texture issue) . Complete: Find a protein shake  . Complete: find a substute for your creamer: try flavored protein shake  . Continue: aim to eat lunch: example: wrap + cooked carrots in the microwave OR chicken + cucumbers + microwave brown rice  . NEW: limit ham to 3 days a week . NEW: aim to have non starchy vegetables with dinner every night  Handouts Previously Provided Include   Detailed my plate  Learning Style & Readiness for Change Teaching method utilized: Visual & Auditory  Demonstrated degree of understanding via: Teach Back  Readiness Level: contemplative Barriers to learning/adherence to lifestyle change: work schedule  RD's Notes for next Visit  . Assess pts adherence to chosen goals . Discuss cutting back on cereal before bed understanding this may be emotionally difficult    MONITORING & EVALUATION Dietary intake, weekly physical activity, body weight, and pre-op goals   Next Steps  Patient is to return to [facility]

## 2020-02-06 ENCOUNTER — Other Ambulatory Visit: Payer: Self-pay | Admitting: Family Medicine

## 2020-02-06 NOTE — Telephone Encounter (Signed)
Is this okay to refill? 

## 2020-02-22 ENCOUNTER — Encounter (INDEPENDENT_AMBULATORY_CARE_PROVIDER_SITE_OTHER): Payer: Self-pay

## 2020-03-05 ENCOUNTER — Encounter: Payer: PRIVATE HEALTH INSURANCE | Attending: Surgery | Admitting: Skilled Nursing Facility1

## 2020-03-05 ENCOUNTER — Other Ambulatory Visit: Payer: Self-pay

## 2020-03-05 DIAGNOSIS — N189 Chronic kidney disease, unspecified: Secondary | ICD-10-CM | POA: Diagnosis present

## 2020-03-05 NOTE — Progress Notes (Signed)
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   Pt states he usually has a bowl of cereal before bed. Pt states his diet is much better than it used to be which was all fast food and also stopped smoking.   Pt state she tried sugar free creamer which was gross so tried sugar substitute and powder creamer which he likes. Pt states he is mentally getting closer to understanding he does need to eat in the middle of the day seeing where he gest more energy longer lasting. Pt states he has stopped salting his foods.   Pt states he has been trying to use spinach in his omelets. Pt states he ahs been adding turley bacon in his mornings and peppers and onions. Pt states he makes sure his office has lunch in it so he keeps granola bars in his office since doing this and eating somthing which ahs resulted in more energy and increased stamina. Pt states he had not been snacking as much stating he was spirling due to depression this month so choosing comfort foods stating he is afraid he is going to have to find a new job which may involve him moving across the country. Pt state she cannot stand the feeling of hunger.   Estimated daily fluid intake: 64+ oz Supplements: multi Current average weekly physical activity: ADL's  24-Hr Dietary Recall First Meal: peppers and onions and [place] sausage and cheese 3 egg oemelot +  coffee Snack: Second Meal: granola or salad: ham, croutons, cheese, pickles, dressing, ice burg lettuce  Snack: chips Third Meal 5-6: frozen chicken sandwich + fries  Snack: life or honey bunches of oats cereal or ice cream Beverages: 20-24 coffee + xyletol and powder creamer, 64 ounces water  Estimated Energy Needs Calories: 1600  NUTRITION DIAGNOSIS  Overweight/obesity (-3.3) related to past poor dietary habits and physical inactivity as evidenced by patient w/ planned sleeve surgery following dietary guidelines for continued weight loss.   NUTRITION INTERVENTION  Nutrition counseling (C-1) and education (E-2) to facilitate bariatric surgery goals.  Pre-Op Goals Progress & New Goals . Continue: Chew food well (difficult due to having a texture issue) . Complete: Find a protein shake  . Complete: find a substute for your creamer: try flavored protein shake  . Continue: aim to eat lunch: example: wrap + cooked carrots in the microwave OR chicken + cucumbers + microwave brown rice  . Continue: limit ham to 3 days a week . continue: aim to have non starchy vegetables with dinner every night . NEW: try morningstar chicken patties . NEW: set up an appt wtih the therapist   Handouts Previously Provided Include   Detailed my plate  Should I eat  Mindful meals  Learning Style & Readiness for Change Teaching method utilized: Visual & Auditory  Demonstrated degree of understanding via: Teach Back  Readiness Level: contemplative Barriers to learning/adherence to lifestyle change: work  schedule  RD's Notes for next Visit  . Assess pts adherence to chosen goals . Discuss cutting back on cereal before bed understanding this may be emotionally difficult    MONITORING & EVALUATION Dietary intake, weekly physical activity, body weight, and pre-op goals   Next Steps  Patient is to return to [facility]

## 2020-03-17 ENCOUNTER — Telehealth: Payer: Self-pay

## 2020-03-17 NOTE — Telephone Encounter (Signed)
[facility] is requesting Dr. Shari Prows to prescribe separate prescription for losartan/HCTZ 100 mg/25 mg tablets, because this medication is on backorder and in order for pt to get medications, this medication has to be prescribed separately. Losartan 100mg  and HCTZ 25 mg. [facility] ph# [phone]. Please address

## 2020-03-18 MED ORDER — LOSARTAN POTASSIUM 100 MG PO TABS: 100.0000 mg | ORAL_TABLET | Freq: Every day | ORAL | 3 refills | Status: DC

## 2020-03-18 MED ORDER — HYDROCHLOROTHIAZIDE 25 MG PO TABS: 25.0000 mg | ORAL_TABLET | Freq: Every day | ORAL | 3 refills | Status: DC

## 2020-03-18 NOTE — Addendum Note (Signed)
Addended by: Roslynn Amble R on: 03/18/2020 12:24 PM   Modules accepted: Orders

## 2020-03-18 NOTE — Telephone Encounter (Signed)
Yes. You can order them separate.

## 2020-03-18 NOTE — Telephone Encounter (Signed)
[facility] is requesting Dr. Shari Prows to prescribe separate prescription for losartan/HCTZ 100 mg/25 mg tablets, because this medication is on backorder and in order for pt to get medications, this medication has to be prescribed separately. Losartan 100mg  and HCTZ 25 mg.   Dr. confirmed separate Rx can be ordered.  Sent new Rx for individual prescriptions.

## 2020-03-19 ENCOUNTER — Ambulatory Visit: Payer: PRIVATE HEALTH INSURANCE | Admitting: Psychology

## 2020-03-31 ENCOUNTER — Ambulatory Visit (INDEPENDENT_AMBULATORY_CARE_PROVIDER_SITE_OTHER): Payer: PRIVATE HEALTH INSURANCE | Admitting: Psychology

## 2020-04-02 ENCOUNTER — Other Ambulatory Visit: Payer: Self-pay

## 2020-04-02 ENCOUNTER — Encounter: Payer: PRIVATE HEALTH INSURANCE | Attending: Surgery | Admitting: Skilled Nursing Facility1

## 2020-04-02 DIAGNOSIS — N189 Chronic kidney disease, unspecified: Secondary | ICD-10-CM | POA: Diagnosis not present

## 2020-04-02 NOTE — Progress Notes (Signed)
Supervised Weight Loss Visit Bariatric Nutrition Education Appt Start Time: 8:25 End Time: 8:59 am  Planned Surgery: sleeve Pt Expectation of Surgery/ Goals: to lose weight  4 out of 6 SWL Appointments   Star given previously: no  NUTRITION ASSESSMENT  Anthropometrics  Start weight at [facility]: 318.4 lbs (date: 12/05/2019) Weight: 328 lbs  Height: 69.5 in BMI: 47.84 kg/m2     Clinical  Medical Hx: HTN, GERD, CKD (not staged), prediabetes Medications: see list Labs: A1C 5.7 Notable Signs/Symptoms: reflux  Lifestyle & Dietary Hx  Patient works as a Programmer, multimedia at [employer]. Moved here from [place], currently lives alone.    Pt states lunch has been better but not perfect. Pt states he noticed skipped lunch results in extra snacking at night, states his late night snacking has gotten better since eating lunch so the frequency has gotten better. Pt states he has gotten rid of his candy bowl. Pt states his depression does not feel as dramatic as it was stating his job concerns have not felt so dramatic and is more in the more acceptance phase stating he realizes when he first gets news he turns to negative thoughts so wants to start working on that new news is bad news mentality with his therapist whom he will be meeting with this Saturday.   Estimated daily fluid intake: 64+ oz Supplements: multi Current average weekly physical activity: ADL's  24-Hr Dietary Recall First Meal: peppers and onions and [place] sausage and cheese 3 egg oemelot + spinach+ coffee (more veggies than meat) Snack: Second Meal: granola or salad: ham, croutons, cheese, pickles, dressing, ice burg lettuce or sometimes cereal or frozen meals Snack: chips or nothing Third Meal 5-6: frozen chicken sandwich + fries or noodles in sauce Snack: life or honey bunches of oats cereal or ice cream or girl scout cookies Beverages: 20-24 coffee + xyletol and powder creamer, 64 ounces water  Estimated Energy  Needs Calories: 1600  NUTRITION DIAGNOSIS  Overweight/obesity ([place]-3.3) related to past poor dietary habits and physical inactivity as evidenced by patient w/ planned sleeve surgery following dietary guidelines for continued weight loss.   NUTRITION INTERVENTION  Nutrition counseling (C-1) and education (E-2) to facilitate bariatric surgery goals.  Pre-Op Goals Progress & New Goals . Continue: Chew food well (difficult due to having a texture issue) . Complete: Find a protein shake  . Complete: find a substute for your creamer: try flavored protein shake  . Continue: aim to eat lunch: example: wrap + cooked carrots in the microwave OR chicken + cucumbers + microwave brown rice  . Continue: limit ham to 3 days a week . continue: aim to have non starchy vegetables with dinner every night . continue: try morningstar chicken patties (did not try) . Accomplished: set up an appt wtih the therapist (this Saturday) . NEW: choose complex carbohydrates; limiting white noodles trying whole wheat or bean . NEW: re read the pre-op goals list to true up some behaviors   Handouts Previously Provided Include   Detailed my plate  Should I eat  Mindful meals  Meal Ideas sheet  Learning Style & Readiness for Change Teaching method utilized: Visual & Auditory  Demonstrated degree of understanding via: Teach Back  Readiness Level: contemplative Barriers to learning/adherence to lifestyle change: work schedule  RD's Notes for next Visit  . Assess pts adherence to chosen goals   MONITORING & EVALUATION Dietary intake, weekly physical activity, body weight, and pre-op goals   Next Steps  Patient is  to return to [facility]

## 2020-04-08 ENCOUNTER — Ambulatory Visit: Payer: PRIVATE HEALTH INSURANCE | Admitting: Psychology

## 2020-04-14 ENCOUNTER — Other Ambulatory Visit: Payer: Self-pay | Admitting: Family Medicine

## 2020-04-14 ENCOUNTER — Ambulatory Visit (INDEPENDENT_AMBULATORY_CARE_PROVIDER_SITE_OTHER): Payer: PRIVATE HEALTH INSURANCE | Admitting: Clinical

## 2020-04-14 ENCOUNTER — Other Ambulatory Visit: Payer: Self-pay

## 2020-04-14 DIAGNOSIS — Z566 Other physical and mental strain related to work: Secondary | ICD-10-CM | POA: Diagnosis not present

## 2020-04-14 DIAGNOSIS — F419 Anxiety disorder, unspecified: Secondary | ICD-10-CM | POA: Diagnosis not present

## 2020-04-14 DIAGNOSIS — F331 Major depressive disorder, recurrent, moderate: Secondary | ICD-10-CM | POA: Diagnosis not present
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
## 2020-04-16 ENCOUNTER — Other Ambulatory Visit: Payer: Self-pay

## 2020-04-16 ENCOUNTER — Ambulatory Visit: Payer: PRIVATE HEALTH INSURANCE | Admitting: Psychology

## 2020-04-16 ENCOUNTER — Other Ambulatory Visit: Payer: Self-pay | Admitting: Family Medicine

## 2020-04-16 MED ORDER — AMLODIPINE BESYLATE 10 MG PO TABS: 10.0000 mg | ORAL_TABLET | Freq: Every day | ORAL | 8 refills | Status: DC

## 2020-04-16 NOTE — Telephone Encounter (Signed)
Ok to refill. Pt has been seeing behavior specialist

## 2020-04-30 ENCOUNTER — Ambulatory Visit (INDEPENDENT_AMBULATORY_CARE_PROVIDER_SITE_OTHER): Payer: PRIVATE HEALTH INSURANCE | Admitting: Clinical

## 2020-04-30 ENCOUNTER — Other Ambulatory Visit: Payer: Self-pay

## 2020-04-30 DIAGNOSIS — F419 Anxiety disorder, unspecified: Secondary | ICD-10-CM

## 2020-04-30 DIAGNOSIS — Z566 Other physical and mental strain related to work: Secondary | ICD-10-CM

## 2020-04-30 DIAGNOSIS — F331 Major depressive disorder, recurrent, moderate: Secondary | ICD-10-CM | POA: Diagnosis not present

## 2020-04-30 NOTE — Progress Notes (Signed)
   THERAPIST PROGRESS NOTE  Session Time: 10am  Participation Level: Active  Behavioral Response: Casual and NeatAlertEuthymic  Type of Therapy: Individual Therapy  Treatment Goals addressed: Coping  Interventions: Supportive  Summary: Kurt Gomez is a 36 y.o. male who presents in a pleasant mood. Pt reports he is working toward improving his self care by preparing more meals at home and working within the limitations of his job. 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 However his current job is considering doing away with the tenure track. Pt states this has caused him to reframe his thinking and adjust to the new circumstances. Pt participated in completion of treatment plan.  Suicidal/Homicidal:Pt denies SI/HI no plan or intent to harm self or others reported.  Therapist Response: CSW assisted pt in completion of treatment plan. CSW probed for feedback on pt's self care routine. CSW discussed with pt importance of self care and being intentional and deliberate when establishing routine. CSW further discussed with pt setting self care reminders on phone.  Plan: Return again in 2 weeks.  Diagnosis: Axis I: Moderate episode of recurrent major depressive disorder    Anxiety    Work related stress    Axis II: No diagnosis    Suzan Slick, LCSW 04/30/2020

## 2020-05-06 ENCOUNTER — Encounter: Payer: PRIVATE HEALTH INSURANCE | Attending: Surgery | Admitting: Skilled Nursing Facility1

## 2020-05-06 ENCOUNTER — Other Ambulatory Visit: Payer: Self-pay

## 2020-05-06 DIAGNOSIS — N189 Chronic kidney disease, unspecified: Secondary | ICD-10-CM | POA: Insufficient documentation

## 2020-05-06 NOTE — Progress Notes (Signed)
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  Pt states he has done well with making smarter choices with food and thinking through his choices and [place] bacon over pork bacon. Pt states she also tried [place] meat instead of beef. Pt states he has not drinking with meals and chewing well and intuitive eating on his fridge to help remind him.  Pt states he has been precooking his breakfast all month. Pt states he has had half as much cereal as he used to.    Estimated daily fluid intake: 64+ oz Supplements: multi Current average weekly physical activity: yoga 4 days a Week; walking in between classes instead of driving   29-JJ Dietary Recall First Meal: veggie and cheese egg casserole Snack: Second Meal: granola or salad: ham, croutons, cheese, pickles, dressing, ice burg lettuce or sometimes cereal or frozen meals or protein bar Snack: chips or nothing Third Meal 5-6: frozen chicken sandwich + fries or noodles in sauce or BBQ chicken + white rice + broccoli and cauliflower  Snack: cereal Beverages: 20-24 coffee + xyletol and powder creamer, 64 ounces water  Estimated Energy Needs Calories: 1600  NUTRITION DIAGNOSIS  Overweight/obesity ([place]-3.3) related to past poor dietary habits and physical inactivity as evidenced by patient w/ planned sleeve surgery following dietary guidelines  for continued weight loss.   NUTRITION INTERVENTION  Nutrition counseling (C-1) and education (E-2) to facilitate bariatric surgery goals.  Pre-Op Goals Progress & New Goals . Continue: Chew food well (difficult due to having a texture issue) . Complete: Find a protein shake  . Complete: find a substute for your creamer: try flavored protein shake  . Continue: aim to eat lunch: example: wrap + cooked carrots in the microwave OR chicken + cucumbers + microwave brown rice  . Continue: limit ham to 3 days a week . continue: aim to have non starchy vegetables with dinner every night . continue: try morningstar chicken patties (did not try) . Accomplished: set up an appt wtih the therapist (this Saturday) . contonue: choose complex carbohydrates; limiting white noodles trying whole wheat or bean . continue: re read the pre-op goals list to true up some behaviors  . NEW: keep up workouts   Handouts Previously Provided Include   Detailed my plate  Should I eat  Mindful meals  Meal Ideas sheet  Learning Style & Readiness for Change Teaching method utilized: Visual & Auditory  Demonstrated degree of understanding via: Teach Back  Readiness Level: contemplative Barriers to learning/adherence to lifestyle change: work schedule  RD's Notes for next Visit  . Assess pts adherence to chosen goals   MONITORING & EVALUATION Dietary intake, weekly physical activity, body weight, and pre-op goals   Next Steps  Patient is to return to [facility] Return for next SWL

## 2020-05-14 ENCOUNTER — Ambulatory Visit (INDEPENDENT_AMBULATORY_CARE_PROVIDER_SITE_OTHER): Payer: PRIVATE HEALTH INSURANCE | Admitting: Clinical

## 2020-05-14 ENCOUNTER — Other Ambulatory Visit: Payer: Self-pay

## 2020-05-14 DIAGNOSIS — Z566 Other physical and mental strain related to work: Secondary | ICD-10-CM | POA: Diagnosis not present

## 2020-05-14 DIAGNOSIS — F419 Anxiety disorder, unspecified: Secondary | ICD-10-CM

## 2020-05-14 DIAGNOSIS — F331 Major depressive disorder, recurrent, moderate: Secondary | ICD-10-CM

## 2020-05-14 NOTE — Progress Notes (Signed)
   THERAPIST PROGRESS NOTE  Session Time: 10am  Participation Level: Active  Behavioral Response: CasualAlertpleasant  Type of Therapy: Individual Therapy  Treatment Goals addressed: Anxiety and Coping  Interventions: DBT and Supportive  Summary: Kurt Gomez is a 35 y.o. male who presents in a pleasant mood. Pt says he was able to finish a book that he has been reading for leisure and practiced yoga a few days since last session. Pt discussed feeling conflicted about his stepmother volunteering him to care for his transgender teenage sister. Pt reports his sister resides with their parents in [place] where there are not a lot of resources for the transgender community. Pt reports he is acting as a mediator between his parents and sister as his parents are having difficulty with the transition.  Suicidal/Homicidal:Pt denies SI/HI no plan or intent to harm self or others reported.  Therapist Response: CSW discussed with 3 mindstates(wise, reasonable, emotional) traits of each one and how they are used to describe a person's thoughts and behaviors. CSW actively listened as pt identified challenges he may experience if his sister were to live in his home. CSW assisted pt in brainstorming ways that he can support his parents and sibling that does not compromise his boundaries. CSW continues to encourage pt to practice daily self care to assist with managing unwanted emotions and/or stress.  Plan: Return again in 2 weeks.  Diagnosis: Axis I: Moderate episode of recurrent major depressive disorder                                     Anxiety                                     Work related stress     Axis II: No diagnosis    Suzan Slick, LCSW 05/14/2020

## 2020-06-02 ENCOUNTER — Encounter: Payer: PRIVATE HEALTH INSURANCE | Attending: Surgery | Admitting: Skilled Nursing Facility1

## 2020-06-02 ENCOUNTER — Other Ambulatory Visit: Payer: Self-pay

## 2020-06-02 DIAGNOSIS — N189 Chronic kidney disease, unspecified: Secondary | ICD-10-CM | POA: Diagnosis present

## 2020-06-02 NOTE — Progress Notes (Signed)
Supervised Weight Loss Visit Bariatric Nutrition Education  Planned Surgery: sleeve Pt Expectation of Surgery/ Goals: to lose weight  6 out of 6 SWL Appointments   Pt completed visits.   Pt has cleared nutrition requirements.   Star given previously: no; does not want them  NUTRITION ASSESSMENT  Anthropometrics  Start weight at [facility]: 318.4 lbs (date: 12/05/2019) Weight: 329.7 lbs  Height: 69.5 in BMI: 47.74 kg/m2     Clinical  Medical Hx: HTN, GERD, CKD (not staged), prediabetes Medications: see list Labs: A1C 5.7 Notable Signs/Symptoms: reflux  Lifestyle & Dietary Hx  Patient works as a Programmer, multimedia at [employer]. Moved here from [place], currently lives alone.  Pt states these last 6 months have been such a change he feels his changes are memorable and will help him to succeed long term. Pt states this is just who I am now so it feels right referring to the changes he has made. Pt states cutting out pasta and bread has been hard so he has been putting a lot of effort into his meals so a lot of spice. Pt states he stopped buying almond milk so he stopped buying cereal and therefor stopped eating cereal in the evening. Pt states he realized he was telling himself the almond milk for his coffee realizing that was not the truth of the matter. Pt states he did drop the ball on yoga stating work got busy stating he will have a space to do it at work coming up.     Estimated daily fluid intake: 64+ oz Supplements: multi Current average weekly physical activity: yoga 4 days a Week; walking in between classes instead of driving   40-HK Dietary Recall First Meal: veggie and cheese egg casserole sometimes with [place] bacon Snack: Second Meal: granola or salad: ham, croutons, cheese, pickles, dressing, ice burg lettuce or sometimes cereal or frozen meals or protein bar or leftovers  Snack: chips or nothing Third Meal 5-6: frozen chicken sandwich + fries or noodles in sauce or BBQ  chicken + white rice + broccoli and cauliflower  Snack:  Beverages: 20-24 coffee + xyletol and powder creamer, 64 ounces water  Estimated Energy Needs Calories: 1600  NUTRITION DIAGNOSIS  Overweight/obesity ([place]-3.3) related to past poor dietary habits and physical inactivity as evidenced by patient w/ planned sleeve surgery following dietary guidelines for continued weight loss.   NUTRITION INTERVENTION  Nutrition counseling (C-1) and education (E-2) to facilitate bariatric surgery goals.  Pre-Op Goals Progress & New Goals . Continue: Chew food well (difficult due to having a texture issue) . Complete: Find a protein shake  . Complete: find a substute for your creamer: try flavored protein shake  . Continue: aim to eat lunch: example: wrap + cooked carrots in the microwave OR chicken + cucumbers + microwave brown rice  . Continue: limit ham to 3 days a week . continue: aim to have non starchy vegetables with dinner every night . continue: try morningstar chicken patties (did not try) . Accomplished: set up an appt wtih the therapist (this Saturday) . contonue: choose complex carbohydrates; limiting white noodles trying whole wheat or bean . continue: re read the pre-op goals list to true up some behaviors  . continue: keep up workouts   Handouts Previously Provided Include   Detailed my plate  Should I eat  Mindful meals  Meal Ideas sheet  Learning Style & Readiness for Change Teaching method utilized: Visual & Auditory  Demonstrated degree of understanding via: Teach Back  Readiness Level: contemplative Barriers to learning/adherence to lifestyle change: work schedule  RD's Notes for next Visit  . Assess pts adherence to chosen goals   MONITORING & EVALUATION Dietary intake, weekly physical activity, body weight, and pre-op goals   Next Steps  Patient is to return to [facility] for pre-op class Pt has completed visits. No further supervised visits required/recomended

## 2020-06-05 ENCOUNTER — Ambulatory Visit (INDEPENDENT_AMBULATORY_CARE_PROVIDER_SITE_OTHER): Payer: PRIVATE HEALTH INSURANCE | Admitting: Clinical

## 2020-06-05 ENCOUNTER — Other Ambulatory Visit: Payer: Self-pay

## 2020-06-05 DIAGNOSIS — Z566 Other physical and mental strain related to work: Secondary | ICD-10-CM | POA: Diagnosis not present

## 2020-06-05 DIAGNOSIS — F331 Major depressive disorder, recurrent, moderate: Secondary | ICD-10-CM

## 2020-06-05 DIAGNOSIS — F419 Anxiety disorder, unspecified: Secondary | ICD-10-CM

## 2020-06-05 NOTE — Progress Notes (Signed)
   THERAPIST PROGRESS NOTE  Session Time: 10am  Participation Level: Active  Behavioral Response: Casual and NeatAlertAnxious  Type of Therapy: Individual Therapy  Treatment Goals addressed: Anxiety and Coping  Interventions: Supportive  Virtual Visit via Video Note  I connected with Kurt Gomez on 06/05/20 at 10:00 AM EDT by a video enabled telemedicine application and verified that I am speaking with the correct person using two identifiers.  Location: Patient: home Provider: office   I discussed the limitations of evaluation and management by telemedicine and the availability of in person appointments. The patient expressed understanding and agreed to proceed.   I discussed the assessment and treatment plan with the patient. The patient was provided an opportunity to ask questions and all were answered. The patient agreed with the plan and demonstrated an understanding of the instructions.   The patient was advised to call back or seek an in-person evaluation if the symptoms worsen or if the condition fails to improve as anticipated.  I provided 45 minutes of non-face-to-face time during this encounter.  Summary: Kurt Gomez is a 35 y.o. male who describes his mood as "stressed" Pt says work has been stressful but some of his personal relationships have improved. Pt reports he continues to pursue bariatric surgery and recently completed nutrition classes. Pt states he has created a private space at work to do yoga and since last visit has performed yoga 2 days per week. Pt discussed feeling stressed and overwhelmed at the idea of his transgender sister possibly coming to live with him at the request of his parents. Pt says sister will coming to visit next week and hopes to link her with individuals in the [place] transgender community that may offer resources and guidance during this time. Pt discussed some of his parents difficulty with pronouns and understanding of the transgender  community.  Suicidal/Homicidal: Pt denies plan or intent to harm self or others. Therapist Response: CSW offered pt validation and support during this time and provided a safe space for him to discuss family dynamics. CSW discussed with pt importance of practice daily self care especially during challenging times and making sure he is intentional and deliberate in doing so. CSW further discussed with pt importance of personal boundaries and supported him as he discussed role within family as big brother and caretaker of younger siblings.  Plan: Return again in 2 weeks.  Diagnosis: Axis I: Moderate episode of recurrent major depressive disorder Anxiety Work related stress     Axis II: No diagnosis    Suzan Slick, LCSW 06/05/2020

## 2020-06-18 ENCOUNTER — Other Ambulatory Visit: Payer: Self-pay | Admitting: Family Medicine

## 2020-06-22 NOTE — Telephone Encounter (Signed)
I have refilled this for you

## 2020-07-22 ENCOUNTER — Other Ambulatory Visit: Payer: Self-pay | Admitting: Family Medicine

## 2020-07-22 NOTE — Telephone Encounter (Signed)
[facility] is requesting to fill pt buspar. Please advise KH 

## 2020-07-24 ENCOUNTER — Ambulatory Visit (INDEPENDENT_AMBULATORY_CARE_PROVIDER_SITE_OTHER): Payer: No Typology Code available for payment source | Admitting: Clinical

## 2020-07-24 ENCOUNTER — Other Ambulatory Visit: Payer: Self-pay

## 2020-07-24 DIAGNOSIS — F419 Anxiety disorder, unspecified: Secondary | ICD-10-CM | POA: Diagnosis not present

## 2020-07-24 DIAGNOSIS — F331 Major depressive disorder, recurrent, moderate: Secondary | ICD-10-CM | POA: Diagnosis not present

## 2020-07-24 NOTE — Progress Notes (Signed)
   THERAPIST PROGRESS NOTE  Session Time: 9am  Participation Level: Active  Behavioral Response: CasualAlertpleasant  Type of Therapy: Individual Therapy  Treatment Goals addressed: Coping  Interventions: Supportive Virtual Visit via Video Note  I connected with Kurt Gomez on 07/24/20 at  9:00 AM EDT by a video enabled telemedicine application and verified that I am speaking with the correct person using two identifiers.  Location: Patient: home Provider: office   I discussed the limitations of evaluation and management by telemedicine and the availability of in person appointments. The patient expressed understanding and agreed to proceed.   I discussed the assessment and treatment plan with the patient. The patient was provided an opportunity to ask questions and all were answered. The patient agreed with the plan and demonstrated an understanding of the instructions.   The patient was advised to call back or seek an in-person evaluation if the symptoms worsen or if the condition fails to improve as anticipated.  I provided 50 minutes of non-face-to-face time during this encounter.   Summary: Kurt Gomez is a 35 y.o. male who reports he is establishing a routine at work that is helpful in him staying organized. Pt says he is practicing yoga and reading books for leisure as a part of his self care routine. Pt reports he continues to work to support his sister and parents as they are finding ways to adjust to recent changes in the household. No significant change in mood or behavior reported. Pt continues to work toward achieving treatment goals.  Suicidal/Homicidal: Pt denies SI/HI no plan or intent to harm self or others reported.  Therapist Response: CSW assesed for changes in mood and behavior. CSW actively listened as pt discussed ways in which he is trying to support his sister and parents during this time. Pt reports his sister is a part of the transgender community. When  asked what is energizing him pt says he purchased an aloe plant and is caring for it. When asked what is draining him pt says he is considering getting back into the dating pool. As a part of his self care routine pt says he is practicing yoga and reading books for leisure. Pt says he is still a candidate for bariatric surgery and is hopeful the surgery will occur soon.  Plan: Return again in 2 weeks.  Diagnosis: Axis I: Moderate episode of recurrent major depressive disorder                                     Anxiety    Axis II: No diagnosis    Suzan Slick, LCSW 07/24/2020

## 2020-08-19 ENCOUNTER — Other Ambulatory Visit: Payer: Self-pay | Admitting: Family Medicine

## 2020-08-19 NOTE — Telephone Encounter (Signed)
Refill x 30 days and get in for med check °

## 2020-09-09 ENCOUNTER — Other Ambulatory Visit: Payer: Self-pay

## 2020-09-09 ENCOUNTER — Ambulatory Visit (INDEPENDENT_AMBULATORY_CARE_PROVIDER_SITE_OTHER): Payer: No Typology Code available for payment source | Admitting: Medical

## 2020-09-09 VITALS — BP 130/80 | HR 93 | Wt 342.2 lb

## 2020-09-09 DIAGNOSIS — N189 Chronic kidney disease, unspecified: Secondary | ICD-10-CM | POA: Diagnosis not present

## 2020-09-09 DIAGNOSIS — F419 Anxiety disorder, unspecified: Secondary | ICD-10-CM

## 2020-09-09 DIAGNOSIS — E785 Hyperlipidemia, unspecified: Secondary | ICD-10-CM | POA: Diagnosis not present

## 2020-09-09 DIAGNOSIS — I10 Essential (primary) hypertension: Secondary | ICD-10-CM

## 2020-09-09 DIAGNOSIS — F32A Depression, unspecified: Secondary | ICD-10-CM

## 2020-09-09 DIAGNOSIS — Z131 Encounter for screening for diabetes mellitus: Secondary | ICD-10-CM

## 2020-09-09 DIAGNOSIS — R7989 Other specified abnormal findings of blood chemistry: Secondary | ICD-10-CM

## 2020-09-09 DIAGNOSIS — G4733 Obstructive sleep apnea (adult) (pediatric): Secondary | ICD-10-CM

## 2020-09-09 DIAGNOSIS — Z9989 Dependence on other enabling machines and devices: Secondary | ICD-10-CM

## 2020-09-09 NOTE — Progress Notes (Signed)
Subjective:  Kurt Gomez is a 35 y.o. male who presents for Chief Complaint  Patient presents with   med check    Med check, wants BP check     Here for med check.  He normally sees Microbiologist here  Hypertension-he is compliant with his medications, multiple blood pressure medications.  He is trying to eat healthy and exercise.  He limit salt.  He sees cardiology as well  OSA-compliant with CPAP which was started about 7 to 8 months ago.  This works well for him now but it took several months to get used to this thing  He has been [facility] [place] [facility] to pursue surgery but this has not happened yet despite greater than 6 months in their program.  There has been delays mainly due to his counseling office not getting correct paperwork to [facility]  Elevated liver test-she does not drink much alcohol.  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  No other aggravating or relieving factors.    No other c/o.  Past Medical History:  Diagnosis Date   Anxiety and depression    At risk for sleep apnea    Elevated liver function tests    GERD (gastroesophageal reflux disease)    HTN (hypertension)    Hyperlipidemia    Morbid obesity (HCC)    Current Outpatient Medications on File Prior to Visit  Medication Sig Dispense Refill   amLODipine (NORVASC) 10 MG tablet Take 1 tablet (10 mg total) by mouth daily. 30 tablet 8   buPROPion (WELLBUTRIN XL) 300 MG 24 hr tablet TAKE 1 TABLET BY MOUTH EVERY DAY 30 tablet 0   busPIRone (BUSPAR) 15 MG tablet TAKE ONE-HALF TABLET (7.5 MG TOTAL) BY MOUTH 2 (TWO) TIMES DAILY. 30 tablet 0   carvedilol (COREG) 6.25 MG tablet Take 1 tablet (6.25 mg total) by mouth as needed (2 times a day, to take if needed if blood pressure remains elevated despite initiation of sprinolactone). 180 tablet 3   hydrochlorothiazide (HYDRODIURIL) 25 MG tablet Take 1 tablet (25 mg  total) by mouth daily. 90 tablet 3   losartan (COZAAR) 100 MG tablet Take 1 tablet (100 mg total) by mouth daily. 90 tablet 3   omeprazole (PRILOSEC) 20 MG capsule Take 20 mg by mouth daily.     spironolactone (ALDACTONE) 25 MG tablet Take 1 tablet (25 mg total) by mouth daily. 90 tablet 3   No current facility-administered medications on file prior to visit.    The following portions of the patient's history were reviewed and updated as appropriate: allergies, current medications, past family history, past medical history, past social history, past surgical history and problem list.  ROS Otherwise as in subjective above    Objective: BP 130/80   Pulse 93   Wt (!) 342 lb 3.2 oz (155.2 kg)   BMI 49.81 kg/m   General appearance: alert, no distress, well developed, well nourished, obese white male Neck: supple, no lymphadenopathy, no thyromegaly, no masses Heart: RRR, normal S1, S2, no murmurs Lungs: CTA bilaterally, no wheezes, rhonchi, or rales Abdomen: +bs, soft, non tender, non distended, no masses, no hepatomegaly, no splenomegaly Pulses: 2+ radial pulses, 2+ pedal pulses, normal cap refill Ext: no edema     Assessment: Encounter Diagnoses  Name Primary?   Primary hypertension Yes   Hyperlipidemia, unspecified hyperlipidemia type    Chronic kidney disease, unspecified CKD stage  Morbid obesity (HCC)    Anxiety and depression    OSA on CPAP    Screening for diabetes mellitus    Elevated LFTs      Plan: Hypertension on numerous medications.  I reviewed his November 2021 cardiology notes.  Continue current medications.  Updated comprehensive metabolic panel today  Anxiety -continue Wellbutrin but increase BuSpar to 1 tablet 50 mg twice daily instead of 1/2 tablet twice daily.  Continue with counseling  Obesity-advised he put pressure on his mental health office to get records to coordinate with bariatric clinic to move forward with his bariatric  surgery  Screening labs for diabetes screen today  Currently on CPAP therapy for sleep apnea  CKD on prior labs-recheck labs today, avoid NSAIDs, discussed goal to keep blood pressure under control, avoid NSAIDs  Hyperlipidemia-we discussed labs from last year that were abnormal.  We discussed low-cholesterol diet, regular exercise and considering medication going forward.  He is not fasting today so we will check a fasting lipid on his next visit.  We discussed the pros and cons of statins and cholesterol medicine.  Elevated LFTs likely due to fatty liver disease.  Pending labs consider adding iron and ferritin and consider abdominal ultrasound  Teion was seen today for med check.  Diagnoses and all orders for this visit:  Primary hypertension -     Comprehensive metabolic panel  Hyperlipidemia, unspecified hyperlipidemia type -     Comprehensive metabolic panel  Chronic kidney disease, unspecified CKD stage  Morbid obesity (HCC)  Anxiety and depression  OSA on CPAP  Screening for diabetes mellitus -     Hemoglobin A1c  Elevated LFTs -     Comprehensive metabolic panel   Follow up: pending labs

## 2020-09-10 ENCOUNTER — Other Ambulatory Visit: Payer: Self-pay | Admitting: Medical

## 2020-09-10 LAB — COMPREHENSIVE METABOLIC PANEL
ALT: 44 IU/L (ref 0–44)
AST: 23 IU/L (ref 0–40)
Albumin/Globulin Ratio: 1.6 (ref 1.2–2.2)
Albumin: 4.5 g/dL (ref 4.0–5.0)
Alkaline Phosphatase: 92 IU/L (ref 44–121)
BUN/Creatinine Ratio: 13 (ref 9–20)
BUN: 14 mg/dL (ref 6–20)
Bilirubin Total: <0.2 mg/dL (ref 0.0–1.2)
CO2: 23 mmol/L (ref 20–29)
Calcium: 10 mg/dL (ref 8.7–10.2)
Chloride: 103 mmol/L (ref 96–106)
Creatinine, Ser: 1.1 mg/dL (ref 0.76–1.27)
Globulin, Total: 2.9 g/dL (ref 1.5–4.5)
Glucose: 116 mg/dL — ABNORMAL HIGH (ref 65–99)
Potassium: 4.3 mmol/L (ref 3.5–5.2)
Sodium: 142 mmol/L (ref 134–144)
Total Protein: 7.4 g/dL (ref 6.0–8.5)
eGFR: 91 mL/min/{1.73_m2} (ref >59)

## 2020-09-10 LAB — HEMOGLOBIN A1C
Est. average glucose Bld gHb Est-mCnc: 126 mg/dL
Hgb A1c MFr Bld: 6 % — ABNORMAL HIGH (ref 4.8–5.6)

## 2020-09-10 MED ORDER — BUPROPION HCL ER (XL) 300 MG PO TB24: 300.0000 mg | ORAL_TABLET | Freq: Every day | ORAL | 1 refills | Status: DC

## 2020-09-10 MED ORDER — BUSPIRONE HCL 15 MG PO TABS: ORAL_TABLET | ORAL | 1 refills | Status: DC

## 2020-10-02 ENCOUNTER — Other Ambulatory Visit: Payer: Self-pay

## 2020-10-02 ENCOUNTER — Ambulatory Visit (INDEPENDENT_AMBULATORY_CARE_PROVIDER_SITE_OTHER): Payer: No Typology Code available for payment source | Admitting: Clinical

## 2020-10-02 DIAGNOSIS — F419 Anxiety disorder, unspecified: Secondary | ICD-10-CM

## 2020-10-02 DIAGNOSIS — F330 Major depressive disorder, recurrent, mild: Secondary | ICD-10-CM | POA: Diagnosis not present

## 2020-10-02 NOTE — Progress Notes (Signed)
   THERAPIST PROGRESS NOTE  Session Time: 8am  Participation Level: Active  Behavioral Response: Casual and NeatAlertpleasant  Type of Therapy: Individual Therapy  Treatment Goals addressed: Coping  Interventions: CBT Virtual Visit via Video Note  I connected with Kurt Gomez on 10/02/20 at  8:00 AM EDT by a video enabled telemedicine application and verified that I am speaking with the correct person using two identifiers.  Location: Patient: home Provider: office   I discussed the limitations of evaluation and management by telemedicine and the availability of in person appointments. The patient expressed understanding and agreed to proceed.   I discussed the assessment and treatment plan with the patient. The patient was provided an opportunity to ask questions and all were answered. The patient agreed with the plan and demonstrated an understanding of the instructions.   The patient was advised to call back or seek an in-person evaluation if the symptoms worsen or if the condition fails to improve as anticipated.  I provided 50 minutes of non-face-to-face time during this encounter.  Summary: Pt presents in a pleasant manner but expressed frustration and anxiety about delays in receiving bariatric surgery. Pt states he he has participated in nutrition classes and has been active in individual therapy. Pt reports his relationship with food is improving and he is becoming more intentional about changing his eating habits. Pt also states he has become more intentional in engaging in yoga and recognizes the purpose it has served in improving his mood. 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 Pt is making progress toward achieving treatment goal.   Suicidal/Homicidal: Pt denies SI/HI no plan, intent or attempt to harm self or others reported.  Therapist Response: CSW assessed for changes in mood, behavior and  daily functioning. CSW probed for feedback on self care routine and intentionality with practicing routine consistently. CSW processed with pt normalizing anxiety being a healthy emotion and acknowledging it becoming problematic when pt has too much of it an unable to manage it in a healthy manner. CSW further processed with pt positive steps to well being  Plan: Pt encouraged to continue practice interventions discussed in sessions. Continue to actively participate in individual therapy sessions  Diagnosis: Axis I: Mild recurrent recurrent major depression                                      Anxiety    Axis II: No diagnosis    Suzan Slick, LCSW 10/02/2020

## 2020-10-10 ENCOUNTER — Other Ambulatory Visit: Payer: Self-pay | Admitting: Family Medicine

## 2020-10-12 NOTE — Telephone Encounter (Signed)
Is this okay to refill? 

## 2020-11-02 ENCOUNTER — Ambulatory Visit (INDEPENDENT_AMBULATORY_CARE_PROVIDER_SITE_OTHER): Payer: No Typology Code available for payment source | Admitting: Clinical

## 2020-11-02 ENCOUNTER — Other Ambulatory Visit: Payer: Self-pay

## 2020-11-02 DIAGNOSIS — F330 Major depressive disorder, recurrent, mild: Secondary | ICD-10-CM | POA: Diagnosis not present

## 2020-11-02 DIAGNOSIS — F419 Anxiety disorder, unspecified: Secondary | ICD-10-CM | POA: Diagnosis not present

## 2020-11-02 NOTE — Progress Notes (Signed)
THERAPIST PROGRESS NOTE  Session Time: 8am  Participation Level: Active  Behavioral Response: Casual and NeatAlertpleasant  Type of Therapy: Individual Therapy  Treatment Goals addressed: Coping  Interventions: CBT Virtual Visit via Video Note  I connected with Kurt Gomez on 11/02/20 at  8:00 AM EDT by a video enabled telemedicine application and verified that I am speaking with the correct person using two identifiers.  Location: Patient: work Provider: office   I discussed the limitations of evaluation and management by telemedicine and the availability of in person appointments. The patient expressed understanding and agreed to proceed.   I discussed the assessment and treatment plan with the patient. The patient was provided an opportunity to ask questions and all were answered. The patient agreed with the plan and demonstrated an understanding of the instructions.   The patient was advised to call back or seek an in-person evaluation if the symptoms worsen or if the condition fails to improve as anticipated.  I provided 54 minutes of non-face-to-face time during this encounter.  Summary: Pt presents in a pleasant mood today but expressed frustration with some of the requirements of the bariatric surgery process. Today, pt discussed his relationship with food and states "it stresses me out" Pt says he worked with a nutritionist for six months and has been implementing the coping strategies he has learned. Pt says he finds the interventions helpful and rarely falters in his practice. Per pt report, he has started changing his relationship with food by challenging irrational beliefs about himself and food and not relying on food for comfort or eating non-nutritious foods during hectic times throughout his day. Pt says his food intake consist of the following menu: Breakfast-two cheese eggs w/veggies, [place] bacon; lunch-soup, dry food or leftovers; dinner-meat, veggies, starch. Pt  says his food intake is much improved because he used to eat a large breakfast and dinner and skipped lunch. Pt states he is now more mindful of what he eats and how much he eats. Additionally, pt reports he has not had soda in one year; it has been fourteen months since he had a cigarette and no longer keeps salt in his house so he can monitor his sodium intake. Pt demonstrates good insight and has become more self aware of his relationship with food.  Suicidal/Homicidal: Pt denies SI/HI no plan, intent or attempt to harm himself or others reported.  Therapist Response: CSW prompted pt to describe his eating patterns and was supported as he acknowledged his history of eating non-nutritious foods, especially during hectic times in his life. Pt was assisted in identifying negative cognitive messages that he associates with his food intake. CSW processed with pt putting thoughts on trial and using socratic questions to challenge unwanted thoughts and emotions attached to food. CSW explored with pt connection between unwanted thoughts and emotions and unhealthy food usage. CSW reviewed with pt healthy coping strategies to challenge irrational beliefs and practicing daily self care routine to promote overall health and wellness. CSW highlighted pt development of insight and self awareness related to his eating habits and the commitment he continues to display related to improving his relationship with food and challenging irrational beliefs about himself and food. Pt continues to make progress toward developing healthier ways to cope with life stressors.  Plan: Pt in agreement to continue practicing the interventions discussed during sessions to assist in managing life stressors. Pt states he plans to continue applying the skills he learned working with a nutritionist to improve  his relationship with food.  Diagnosis: Axis I: Mild recurrent recurrent major depression                                       Anxiety    Axis II: No diagnosis    Kurt Slick, LCSW 11/02/2020

## 2020-11-17 NOTE — Telephone Encounter (Signed)
pt called back to check the status of this

## 2020-11-19 ENCOUNTER — Other Ambulatory Visit: Payer: Self-pay | Admitting: Medical

## 2020-11-19 MED ORDER — MOUNJARO 2.5 MG/0.5ML ~~LOC~~ SOAJ: 2.5000 mg | SUBCUTANEOUS | 0 refills | Status: DC

## 2020-11-21 ENCOUNTER — Telehealth: Payer: Self-pay

## 2020-11-21 NOTE — Telephone Encounter (Signed)
P.A. MOUNJARO 

## 2020-11-24 NOTE — Telephone Encounter (Signed)
P.A. was denied as expected, activated discount card and called into pharmacy went thru for $25.  Called pt and informed

## 2020-11-29 ENCOUNTER — Ambulatory Visit (INDEPENDENT_AMBULATORY_CARE_PROVIDER_SITE_OTHER): Payer: No Typology Code available for payment source | Admitting: Psychology

## 2020-11-30 ENCOUNTER — Ambulatory Visit: Payer: No Typology Code available for payment source | Admitting: Psychology

## 2020-12-08 ENCOUNTER — Other Ambulatory Visit: Payer: Self-pay | Admitting: Medical

## 2020-12-14 ENCOUNTER — Other Ambulatory Visit: Payer: Self-pay | Admitting: Medical

## 2021-01-05 ENCOUNTER — Other Ambulatory Visit: Payer: Self-pay | Admitting: Medical

## 2021-02-04 ENCOUNTER — Other Ambulatory Visit: Payer: Self-pay | Admitting: Family Medicine

## 2021-02-04 ENCOUNTER — Other Ambulatory Visit: Payer: Self-pay | Admitting: Medical

## 2021-02-04 NOTE — Telephone Encounter (Signed)
[facility] is requesting to fill pt buspar. Please advise KH 

## 2021-02-08 ENCOUNTER — Other Ambulatory Visit: Payer: Self-pay

## 2021-02-08 MED ORDER — CARVEDILOL 6.25 MG PO TABS: 6.2500 mg | ORAL_TABLET | ORAL | 3 refills | Status: DC | PRN

## 2021-02-10 ENCOUNTER — Ambulatory Visit (INDEPENDENT_AMBULATORY_CARE_PROVIDER_SITE_OTHER): Payer: No Typology Code available for payment source | Admitting: Clinical

## 2021-02-10 ENCOUNTER — Other Ambulatory Visit: Payer: Self-pay

## 2021-02-10 DIAGNOSIS — F330 Major depressive disorder, recurrent, mild: Secondary | ICD-10-CM | POA: Diagnosis not present

## 2021-02-10 DIAGNOSIS — F419 Anxiety disorder, unspecified: Secondary | ICD-10-CM | POA: Diagnosis not present

## 2021-02-10 NOTE — Progress Notes (Signed)
° °  THERAPIST PROGRESS NOTE  Session Time: 1pm  Participation Level: Active  Behavioral Response: Casual and NeatAlertpleasant  Type of Therapy: Individual Therapy  Treatment Goals addressed: Coping  Interventions: Supportive Virtual Visit via Video Note  I connected with [name] on 02/10/21 at  1:00 PM EST by a video enabled telemedicine application and verified that I am speaking with the correct person using two identifiers.  Location: Patient: home Provider: office   I discussed the limitations of evaluation and management by telemedicine and the availability of in person appointments. The patient expressed understanding and agreed to proceed.   I discussed the assessment and treatment plan with the patient. The patient was provided an opportunity to ask questions and all were answered. The patient agreed with the plan and demonstrated an understanding of the instructions.   The patient was advised to call back or seek an in-person evaluation if the symptoms worsen or if the condition fails to improve as anticipated.  I provided 45 minutes of non-face-to-face time during this encounter.  Summary: Pt last seen on 11/02/20. Pt presents in a pleasant manner and reports decrease in anxiety and depression. Pt states he visited his family for the holidays and had a good time. Pt says he continues to work toward [employer] and is exercising more routinely. 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 Pt reports he is gaining understanding and awareness between what is deemed as "lazy vs taking a break" Pt reports he continues to work on being kind to himself during this journey as he works to establish a better relationship with food and how he views himself.   Suicidal/Homicidal: Pt denies SI/HI no plan, intent or attempt to harm self or others reported.  Therapist  Response: CSW assessed for changes in mood, behavior and daily functioning. Verbally praised pt for progress toward improving relationship with food. Discussed with pt creating and maintaining healthy boundaries.  Plan: Pt states he will contact office to schedule next appt.  Diagnosis: Axis I:  Mild recurrent recurrent major depression                                      Anxiety   Axis II: No diagnosis    Yvette Rack, LCSW 02/10/2021

## 2021-02-26 ENCOUNTER — Ambulatory Visit: Payer: Self-pay | Admitting: Surgery

## 2021-02-26 NOTE — H&P (Signed)
Kurt Gomez [id number]   Referring Provider:  Self   Subjective   Chief Complaint: return weight loss     History of Present Illness: 36 year old man who is following up in anticipation of upcoming bariatric surgery.  I initially saw him over a year ago to begin the process.  His obesity is complicated by hypertension on multiple medications, hyperlipidemia managed by diet, anxiety and depression well managed with medication, GERD well managed with over-the-counter medications, elevated LFTs likely secondary to hepatic steatosis, prediabetes, and high risk for sleep apnea. He underwent chest x-ray and upper GI did show reflux but no hiatal hernia.  There was some concern regarding the status of mood and eating concerns raised by his psychology evaluation and there was some back-and-forth between his evaluator and his usual therapist.  The patient was able to work through this and noted some improvement on most recent evaluation and has been approved from a psychology standpoint also had a positive H. pylori screen which was treated.  Completed 6 months of supervised weight loss and has been cleared by the dietitian.  His disease has worsened in the interim.  His A1c has risen to 6, his weight has increased.  Otherwise denies any changes in his health and is ready to proceed.  Has several insightful questions to discuss today.   Initial visit 11/08/19:  "This is a very pleasant 36 year old who presents for consultation regarding surgical management of severe obesity. 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  He has tried numerous diets and exercise plans throughout the course of his young life with intermittent but temporary success.  He was briefly on phentermine when he  was younger, but had significant side effects including poor sleep and anger problems which made this an untenable treatment plan.  Now that he is out of grad school and beginning to get settled with his career, he feels that he is in a good position to pursue aggressive treatment of his obesity before it becomes life-threatening. Medical history includes hyperlipidemia, hypertension (on 3 medications), GERD, elevated liver function tests likely secondary to hepatic steatosis, prediabetes, anxiety and depression, and at risk for sleep apnea.  Medications include amlodipine, losartan-HCTZ, metoprolol, bupropion and buspirone.  He does endorse a history of reflux and takes over-the-counter omeprazole daily.  His symptoms are well controlled with this. No prior abdominal surgery. Family history notable for depression and his mother, hypertension, hyperlipidemia and diabetes in his father.  He endorses a significant family history of obesity including both parents and several aunts and uncles.  Much of his weight loss efforts and his young life were correlated with his mother's attempts at weight loss. Denies alcohol, drug use.  Former smoker, quit August 15 2019.  He reports that he does not do a large amount of physical activity at this point yet, and that he is still working on his eating patterns, noting that he does enjoy fast food from time to time.  He has a friend in [place] who is going through the bariatric process as well and the plan to be each other's accountability partners.  Recent physical with labs performed on 10/15/19 including a CMP which is notable for creatinine of 1.28 and mild elevation and ALT to 56,  but otherwise unremarkable; lipid profile demonstrates elevated total cholesterol, elevated LDL and elevated triglycerides; CBC which is unremarkable, hemoglobin A1c 5.7, TSH, T3 and T4 within normal limits, hepatitis A, B, and C panel which was all negative, urinalysis unremarkable  except for trace protein, and EKG which showed normal sinus rhythm.  315.5lb/BMI 46  OBESITY, MORBID, BMI 40.0-49.9 (E66.01) Story: He is a good candidate for sleeve gastrectomy. We discussed the surgery including technical aspects, the risks of bleeding, infection, pain, scarring, injury to intra-abdominal structures, staple line leak or abscess, chronic abdominal pain or nausea, worsened GERD, DVT/PE, pneumonia, heart attack, stroke, death, failure to reach weight loss goals and weight regain, hernia. Discussed the typical pre-, peri-, and postoperative course. Discussed the importance of lifelong behavioral changes to combat the chronic and relapsing disease which is obesity. Questions were welcomed and answered, will initiate him on the bariatric pathway. He reports that his insurance plan has a 6 month supervised weight loss mandate. HYPERTENSION (I10) Story: On 3 medications, per PCP HYPERLIPIDEMIA (E78.5) Story: not currently on medications, managed by primary care doctor ANXIETY AND DEPRESSION (F41.9) Story: Well controlled on current medications. He expressed interest in therapy sessions beyond the initial bariatric clearance- Will refer him to [facility] for initial bariatric evaluation and hope he can continue working with them GERD (GASTROESOPHAGEAL REFLUX DISEASE) (K21.9) Story: Well controlled with over-the-counter medications. He understands that this may worsen after sleeve gastrectomy. ELEVATED LFTS (R79.89) Story: Mild, likely secondary to hepatic steatosis PREDIABETES (R73.03) Story: Hemoglobin A1c 5.7. He does have a family history of diabetes. AT RISK FOR OBSTRUCTIVE SLEEP APNEA (Z91.89) Story: He scored fairly high on the questionnaire, we will order a sleep study"  Review of Systems: A complete review of systems was obtained from the patient.  I have reviewed this information and discussed as appropriate with the patient.  See HPI as well for other ROS.   Medical  History: Past Medical History:  Diagnosis Date   Anxiety    Hypertension    Sleep apnea     There is no problem list on file for this patient.   Past Surgical History:  Procedure Laterality Date   TONSILLECTOMY  1999     No Known Allergies  Current Outpatient Medications on File Prior to Visit  Medication Sig Dispense Refill   amLODIPine (NORVASC) 10 MG tablet Take 1 tablet by mouth once daily     carvediloL (COREG) 6.25 MG tablet Take by mouth     buPROPion (WELLBUTRIN XL) 300 MG XL tablet Take 300 mg by mouth once daily     busPIRone (BUSPAR) 15 MG tablet Take 15 mg by mouth 2 (two) times daily     hydroCHLOROthiazide (HYDRODIURIL) 25 MG tablet Take 25 mg by mouth once daily     losartan (COZAAR) 100 MG tablet Take 100 mg by mouth once daily     MOUNJARO 2.5 mg/0.5 mL PnIj INJECT 2.5 MG SUBCUTANEOUSLY WEEKLY     omeprazole (PRILOSEC) 20 MG DR capsule Take 20 mg by mouth once daily     spironolactone (ALDACTONE) 25 MG tablet Take 25 mg by mouth once daily     No current facility-administered medications on file prior to visit.    Family History  Problem Relation Age of Onset   Skin cancer Mother    High blood pressure (Hypertension) Mother    Obesity Mother    Diabetes Father    Hyperlipidemia (Elevated cholesterol) Father    Obesity Father  High blood pressure (Hypertension) Father      Social History   Tobacco Use  Smoking Status Former   Types: Cigarettes  Smokeless Tobacco Never     Social History   Socioeconomic History   Marital status: Single  Tobacco Use   Smoking status: Former    Types: Cigarettes   Smokeless tobacco: Never  Substance and Sexual Activity   Alcohol use: Never   Drug use: Never    Objective:    Vitals:   02/26/21 1530  BP: 120/84  Pulse: 105  Temp: 37.1 C (98.8 F)  SpO2: 97%  Weight: (!) 148.4 kg (327 lb 3.2 oz)  Height: 175.3 cm (5\' 9" )    Body mass index is 48.32 kg/m.  Alert and well-appearing Unlabored  respirations  Assessment and Plan:  Diagnoses and all orders for this visit:  Morbid obesity (CMS-HCC)  Essential hypertension  Hyperlipidemia, unspecified hyperlipidemia type  Situational mixed anxiety and depressive disorder  Gastroesophageal reflux disease without esophagitis  Hepatic steatosis  Prediabetes  OSA on CPAP     He remains an excellent candidate for sleeve gastrectomy.  We have previously discussed the surgery including technical details, risks of bleeding, infection, pain, scarring, injury to intra-abdominal structures, staple line leak or abscess, chronic abdominal pain or nausea, worsening GERD, failure to maintain weight loss, weight regain, hernia, DVT/PEs, cardiovascular/pulmonary complications.  Questions welcomed and answered to his satisfaction.  Wrigley Winborne Carlye GrippeAMANDA Layal Javid, MD

## 2021-03-03 ENCOUNTER — Ambulatory Visit (INDEPENDENT_AMBULATORY_CARE_PROVIDER_SITE_OTHER): Payer: No Typology Code available for payment source | Admitting: Psychology

## 2021-03-03 DIAGNOSIS — F509 Eating disorder, unspecified: Secondary | ICD-10-CM

## 2021-03-03 NOTE — Progress Notes (Signed)
Time Spent: 12:36   pm - 1:11 pm : 35 Minutes  [name] participated in the session via  video from home and consented to treatment. We met online due to COVID-19 pandemic. Therapist participated from home office.   The updated measures were reviewed prior to the assessment (10 minutes).   We completed the feedback session which includes reviewing the following measures: Beck Anxiety Inventory (BAI), Beck's Depression Index, Eating Disorder Diagnostic Scale (EDDS), Mental Status Examination (MSE) (10 minutes [administration and scoring]), & Mood Disorder Questionnaire (MDQ). The current measures were compared to the previous iteration of the measures, which was also reviewed during this feedback session. Adarius Tigges provided an update since our last follow-up. An addendum was written (15 minutes) to integrate the results of the new measures, which included the update provided by the patient. The addendum was signed and submitted to the surgeon for review. Torren Maffeo is aware and in agreement.  The interactive feedback session was completed today and a total of 35 minutes was spent on feedback alone. Code [id number] was billed for feedback session.  Kutler Vanvranken is approved for surgery.  Diagnosis code: e66.01   Buena Irish, LCSW

## 2021-03-03 NOTE — Progress Notes (Signed)
Bariatric Evaluation    CONFIDENTIAL    Client Name: Kurt Gomez                                              MRN:  [id number] Date of Birth: [birth year]                                                            Date of Evaluation: 03/31/20 Total Assessment Time: 81 Minutes                                                       Date of Report: 03/31/20 Evaluator: Buena Irish, MSW, LCSW                                                        Date of Follow-up: 11/29/2020 Referring Physician: Dr. Windle Guard, [place]                                                         Date of 2nd Follow-up: 03/03/2021  [facility]     Sources of Information Background Interview Bariatric Questionnaire [place] Interview for Gastric Bypass Beck Anxiety Inventory (BAI) Beck Depression Inventory, 2nd Edition (BDI-II) Eating Disorder Diagnostic Scale (EDDS) Mental Status Examination Mood Disorder Questionnaire (MDQ) Review of Medical Record (provided by [facility]) Email from Therapy    Patient Identification and Chief Complaint: Kurt Gomez was born in [place]. Kurt Gomez moved to [place] to pursue a PhD. 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 Kurt Gomez currently lives alone and is currently building a support system in the area.   There is a family history of hypertension and diabetes. There is a maternal history of anxiety and depression. Kurt Gomez and his brother also have a history of anxiety and depression. There is a maternal family history of alcohol and substance use for grandmother and grandfather. There is a family history of hypertension (mother and father) and diabetes (father).   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 He has a PhD in writing, which he received in August, 2021.  Kurt Gomez has a history of hypertension, pre-diabetes, sleep apnea (CPAP), GERD, depression, and anxiety. Kurt Gomez is a previous smoker but discontinued in July 2021. Kurt Gomez is  prescribed Bupropion 365m qd and Buspirone 110mBID and takes his medication consistently and without complaint. Please see chart for complete medication list.    Patient's Understanding of the Procedure: [name] his interest in the gastric sleeve. He provided a reasonable description of the surgical process including the use of laparoscopy, removal of a large portion of the stomach, and creation of a pouch. He displayed  awareness of the many of the risks associated with the surgery and use of general anesthesia. He would benefit from a review of the side-effects associated with surgery. He is generally aware of the dietary requirements, post-surgery, including the varying stages. Kurt Gomez discussed his motivation for surgery including a need to improve his health and fitness. He discussed the positive effects on his self-esteem as well. He noted his many attempts at weight-loss that were ultimately unsuccessful.    History of Weight Gains and Losses: Current weight, height, desired weight: 320lbs, 5'10, 200lbs.  Rate current activity level from 1 (sedentary) to 10 (very active): 3 Methods of exercise: ~1 mile 5x per week at work.  Eating Disorder History (binging, purging, laxatives): Kurt Gomez denied a history of disordered eating.  History of dieting and results:  2007: Weight Watchers, 15lbs lost, maintained for 2 months.  2009: Nutri-System; 10lbs lost; maintained for 3 months.  2009-2010: Slim Fast, lost 35lbs, maintained for 6 months.  2018[name] Seen- 2018, 20lbs lost, maintained for 1 month.  2019: Keto. No weight-loss.  2020: Keto, lost 60lbs, maintained for 6 months.   Clever discussed additional attempts at weight-loss including reduction of soda consumption and cooking at home, more often.   Diet and Plans for the Future:  Breakfast Omelet or Eggs, sausage/turkey bacon, and cheese  Lunch I often skip lunch, but when I do eat it I usually have a sandwich  Dinner Pastas with  meat and sauce and I like to make casseroles  Snacks Typically no cereal.     Plans for exercise post-surgery: Averill would like to engage in cardio/aerobic exercise. Kurt Gomez would like to begin Yoga again. How pt copes with stress: Work, previous smoker, drinking water, social media, watching TV, therapy.   Other planned changes for the future: Improving overall health and fitness.   Mental Status Examination: [name] was early to the session which was completed via video-conferencing interview. He was dressed casually and his hygiene was observed to be good. He was oriented to person, place, and time. His attitude was cooperative and cheerful. There were no unusual psychomotor movements or changes. Speech patterns were normal in rate, tone, volume, and without pressure. Affect was reactive and mood congruent. Thought processes were goal-directed and logical. Memory and concentration for both long and short-term were intact. Insight and judgement were good overall.   Intellectual and Psychological Functioning Kurt Gomez was very warm and friendly. He presented with no significant formal thought disorder. He appears to be primarily facing health stressors, which aligns with his interest in the bariatric process. Kurt Gomez completed the Mood disorder Questionnaire -and did not meet criteria for Bipolar Disorder. Additionally, he completed the following measures, Beck's Depression Index netted a score of      26/63, which is indicative of moderate depression symptomatology. Beck Anxiety Inventory netted a score   33/63 which is indicative of severe anxiety symptomatology, and Eating Disorder Diagnostic Scale - which highlighted binging behavior. Within the BDI, Kurt Gomez endorsed suicidal ideation but denied any history in the past year. Additionally, Kurt Gomez completed The [place] Interview for Gastric Bypass during the session, which highlighted similar findings. Kurt Gomez noted difficulty with  managing portion control and stated portions are my big issue. He provided an examples of his binging including eating a box of macaroni and cheese along with bread and a sandwich. Additional examples include eating .5lbs of pasta in one sitting. He noted, around three weeks ago, having a large parmesan chicken meal with  pasta, bread, and three slices of cheese cake. 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 He noted late-night snacking including 2 cups of cereal. Roshon would benefit from counseling to address his mood related concerns and engage in mindful eating, bolstering coping skills, and managing his mood without food. Easter has an initial assessment with a therapist and a release will be completed to communicate with therapist. Feedback was provided to Khalid's registered dietician to address behavioral food related concerns.   11-30-19:  Chibuikem Thang was very warm and friendly. He presented with no significant formal thought disorder. He appears to be primarily facing health stressors, which aligns with his interest in the bariatric process. Kurt Gomez completed the Mood disorder Questionnaire -and did not meet criteria for Bipolar Disorder. Additionally, he completed the following measures, Beck's Depression Index netted a score of     3/63, which is indicative of normal life ups and downs (depressive symptomatology). Beck Anxiety Inventory netted a score   5/63 which is indicative of minimal anxiety symptomatology, and Eating Disorder Diagnostic Scale - in which Laymond did not meet clinical criteria for disordered eating.    Jordon noted making significant strides in mood management and eating styles and behaviors since the initial evaluation date (03/31/20). Aaidyn has been attending consistent therapy with Sanjuana Kava with [facility] to address the aforementioned concerns.   Haydn noted improvement in his overall mood and noted this  primarily due to therapy and self-care. Maxie noted decreased occupational stress, which was a contributing factor to the improvement in his overall mood.    Tyge noted making significant changes in his overall food and relationship with food. He noted being much more mindful while consuming his meals, specifically noting eating more slowly and eliminating distractions. Kahne noted being more present during meal time and noted his efforts during and between therapy sessions to maintain a healthful approach to diet and eating.   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 He noted that he has been viewing food as fuel to move his body and paying attention to his level of satiety.   Jeff has denied any binging behavior, or loss of control while eating, since~4.5 months ago. Additionally, he has increased his exercise including walking and yoga on a more consistent basis.   Breakfast Omelet or Eggs with vegetables.   Lunch Prepared salad or left-overs.   Dinner Protein, vegetable, and starch.   Snacks Granola bar, chips, ice cream, or cereal     Ramal discussed significant reduction of processed food and fast-food. He discussed the reduction of excessive carbohydrates and noted eating one starch with a meal in lieu of two. He continues to take his medication consistently and without concern. He denied any changes in his psychotropic medication since the initial evaluation.   Dyon's therapist, [place], provided feedback via email. This email will be included with this evaluation for review. Ms. Richmond Campbell email, dated 11/19/20, noted that Lehman continues to consistently engage in therapy and is applying strategies/coping methods he learned to establish a healthier relationship with food. Please see email communication for additional details.   Yidel will be recommended to continue  with therapy at this time, and post-operatively, to receive additional support during that transitionary time.   Conclusions & Recommendations Mr. Rebekah Sprinkle is a 36 year-old Caucasian male who was referred to  the [place] division by Dr. Windle Guard at [facility] for a psychological evaluation to determine his suitability for bariatric surgery.   With regard to bariatric surgery, Mr. Caelin Rayl appears to be highly motivated and has a solid understanding of the gastric bypass surgery, as well as risks and lifestyle changes needed to  promote post-surgical weight loss success. Results of this evaluation yielded a past history of depression and anxiety. At this time, Mr. Venkat Ankney appears to be able to make an informed decision about the surgery he is contemplating. He expressed understanding of the post-surgical requirements and appears to me motivated to complete said requirements. If his surgery is scheduled more than 3 months from today's date (11/29/2020) he is required to schedule a follow-up visit in order for this examiner to briefly re-evaluate his psychological status at that time. This follow-up visit should occur within two months of his scheduled surgery date.  The following recommendations are offered in order to promote Mr. Sherrel Ran's health and well-being:  Arsenio will be recommended to continue counseling in order to utilize mental health services pre and post-operatively, receive support during this transitionary period, and continue to build-up on the changes he has made in therapy.   It is recommended that the patient participate in educational sessions regarding a healthy diet and post-operative meal planning with a dietician or other health care providers.   Re-evaluation. If Mr. Faruq Patel's surgery is scheduled more than three months from his date of approval, his is required to contact our office [phone]) for a brief check-in  appointment about a month prior to his surgery date.   Nutritional Counseling. Mr. March Steyer is strongly encouraged to continue attending nutritional counseling appointments in order to plan a healthy diet and post-operative meals. It should be noted that Mr. Barney Haggar's daily diet includes foods high in sugar, fats, and carbohydrates; he is encouraged to make recommended changes to her diet prior to surgery in order to increase the chances of continuing a healthy diet after surgery.   Exercise. Mr. Cortlandt Capuano is encouraged to participate in educational sessions on exercise that will be appropriate for her medical conditions and support her weight loss plans in a safe and healthy manner. Specifically, he is encouraged to consider participating in the Bariatric Exercise and Lifestyle Transformation (BELT) program, a partnership between [facility]. There, you'll join fellow [facility] bariatric patients three times a week for personalized aerobic and strength training instruction, as well as educational sessions on diet, exercise and behavior modification strategies. BELT meets at [place] at [employer].   Support groups. The patient is encouraged to join a support group to give him/her encouragement as s/he faces the psychological adjustments of bariatric surgery and the need to significantly adjust one's meals and food choices. A list of support groups offered through [facility] can be found through the bariatrics department website: [facility]  Self-help resources.  To develop strategies for managing emotional difficulties encountered before and after weight loss surgery, the patient is encouraged to read The Emotional First + Aid Kit: A Practical Guide to Life After Bariatric Surgery, Second Edition by  Caren Griffins L. Sheppard Coil, PsyD. Examples of strategies discussed in this  book include relieving stress without using food, developing and maintaining an exercise program, preventing relapse, etc. Mr. Samil Mecham is strongly encouraged to practice mindful eating, the goal of which is to pay close attention to the smell,  sight, taste, temperature, texture, etc. of food. Eating mindfully helps to eat slower while enjoying food more fully. Useful books on mindful eating include Savor: Mindful Eating, Mindful Life and How to Eat, both by mindfulness expert Thich Nhat Hanh.  Mental health treatment. For additional support either prior to or after the surgery, Mr. Eliam Snapp may consider seeking the care of a therapist (counselor, psychologist) in order to develop skills for coping with the adjustment to a new lifestyle. Available therapists include this examiner, as well as other clinicians within the [place] [phone]). he may also seek other in-network providers in the community by searching online at DustingSprays.pl.   General recommendations for bariatric surgery: Replace the habit of late night snacking with something else (e.g., chewing gum, drinking water, or a relaxing activity like reading or crosswords that occupies your hands) and consider going to bed earlier.  Practice eating 4-6 meals per day. Each meal should last about 20 minutes. Practice drinking liquids 30 minutes before or after meals. Keep a food diary for 1 week. Record all foods eaten during the day, including snacks and drinks. Be very specific and very honest.  Get into the habit of reading food labels to evaluate content of protein, sugars, carbohydrates, sodium, etc.  Continue to eat lots of vegetables.  Prepare meals at home, rather than take-out or fast food.  Take multivitamins including zinc and iron.  Develop exercise plans, including a low-impact and safe exercise plan to start 4-5 weeks into recovery, and a more intensive exercise plan for later.   Determine who will take care of any major responsibilities (particularly those involving physical activity, such as childcare) in the early stages of your recovery.  Educate family and friends who will be involved in your recovery about the extent and importance of your new lifestyle changes. The more they know, the better they can support you and help you stay on track!                                             11/29/2020                 __________________________________    ______________________ Buena Irish, MSW, LCSW      Date Licensed Clinical Social Worker [facility] [phone]                                                              ADDENDUM                                       03/03/2020  Mr. Armonie Staten arrived to the session, via video-conferencing, from his home, appearing neatly groomed. He was pleasant, cooperative, and easily engaged. He discussed his need for a mental status update to the assessment that was previously completed. He noted this being the final step needed prior to surgery.     Arnaldo discussed the events of the past three months including his efforts to prepare for surgery. Alonte has completed all necessary steps prior to surgery with the exception  of his final nutrition consult regarding the post-operative diet. Medard has made behavioral changes as he prepares for surgery, including discarding unhealthful food from his home and online shopping to adhere to a healthful meal plan.   Hoang denied any changes in mood or health. Additionally, Rana denied any interpersonal and professional stressors since our last follow-up in October, 2022. He continues to take his prescribed medication as instructed and attends therapy regularly, by self-report. He was advised by the evaluator to contact his prescriber regarding his psychotropic medication due to possible necessary change post-surgery.    Prior to the session, Claude completed the  Mood disorder Questionnaire - and did not endorse any primary symptoms or history of. Additionally, Larkin Ina completed Beck's' Depression Inventory -score 5, Beck Anxiety Inventory -score 7, and Eating Disorder Diagnostic Scale - of which Bryson does not meet criteria for an eating disorder. Overall, he reported functioning well, per report, which is reflected by the scoring of his measures in comparison to his initial iteration during our initial assessment and the initial follow-up.    Dimitris presented as alert and oriented to person, place, time, and self. There is no indication of a thought disorder or unaddressed mood concern. No indication of delusions or hallucinations. He completed a mini-mental status examination, which expires 3 months from today (03/03/20), which did not highlight any concerns. Prognosis for successful surgery remains good.                                                                                                                                                                                                                                                      03/03/2021   __________________________________    ___________________ Buena Irish, MSW, LCSW      Date Licensed Clinical Social Worker [facility] [phone]

## 2021-03-05 ENCOUNTER — Other Ambulatory Visit: Payer: Self-pay | Admitting: Medical

## 2021-03-14 ENCOUNTER — Other Ambulatory Visit: Payer: Self-pay | Admitting: Family Medicine

## 2021-03-15 ENCOUNTER — Other Ambulatory Visit: Payer: Self-pay | Admitting: *Deleted

## 2021-03-15 ENCOUNTER — Other Ambulatory Visit: Payer: Self-pay

## 2021-03-15 ENCOUNTER — Other Ambulatory Visit: Payer: Self-pay | Admitting: Medical

## 2021-03-15 ENCOUNTER — Encounter: Payer: No Typology Code available for payment source | Attending: Surgery | Admitting: Skilled Nursing Facility1

## 2021-03-15 DIAGNOSIS — N189 Chronic kidney disease, unspecified: Secondary | ICD-10-CM | POA: Diagnosis not present

## 2021-03-15 MED ORDER — LOSARTAN POTASSIUM 100 MG PO TABS: 100.0000 mg | ORAL_TABLET | Freq: Every day | ORAL | 0 refills | Status: DC

## 2021-03-15 MED ORDER — HYDROCHLOROTHIAZIDE 25 MG PO TABS: 25.0000 mg | ORAL_TABLET | Freq: Every day | ORAL | 0 refills | Status: DC

## 2021-03-15 NOTE — Progress Notes (Signed)
Pre-Operative Nutrition Class:    Patient was seen on 03/15/2021 for Pre-Operative Bariatric Surgery Education at [facility].    Surgery date:  Surgery type: sleeve Start weight at [facility]: 318.4 Weight today: 328.6  The following the learning objectives were met by the patient during this course: Identify Pre-Op Dietary Goals and will begin 2 weeks pre-operatively Identify appropriate sources of fluids and proteins  State protein recommendations and appropriate sources pre and post-operatively Identify Post-Operative Dietary Goals and will follow for 2 weeks post-operatively Identify appropriate multivitamin and calcium sources Describe the need for physical activity post-operatively and will follow MD recommendations State when to call healthcare provider regarding medication questions or post-operative complications When having a diagnosis of diabetes understanding hypoglycemia symptoms and the inclusion of 1 complex carbohydrate per meal  Handouts given during class include: Pre-Op Bariatric Surgery Diet Handout Protein Shake Handout Post-Op Bariatric Surgery Nutrition Handout BELT Program Information Flyer Support Group Information Flyer [facility] Bariatric Supplements Price List  Follow-Up Plan: Patient will follow-up at [facility] 2 weeks post operatively for diet advancement per MD.

## 2021-03-15 NOTE — Addendum Note (Signed)
Addended by: Burnetta Sabin on: 03/15/2021 07:26 AM   Modules accepted: Orders

## 2021-03-30 NOTE — Patient Instructions (Addendum)
DUE TO COVID-19 ONLY ONE VISITOR IS ALLOWED TO COME WITH YOU AND STAY IN THE WAITING ROOM ONLY DURING PRE OP AND PROCEDURE.   **NO VISITORS ARE ALLOWED IN THE SHORT STAY AREA OR RECOVERY ROOM!!**  IF YOU WILL BE ADMITTED INTO THE HOSPITAL YOU ARE ALLOWED ONLY TWO SUPPORT PEOPLE DURING VISITATION HOURS ONLY (7 AM -8PM)   The support person(s) must pass our screening, gel in and out, and wear a mask at all times, including in the patients room. Patients must also wear a mask when staff or their support person are in the room. Visitors GUEST BADGE MUST BE WORN VISIBLY  One adult visitor may remain with you overnight and MUST be in the room by 8 P.M.  No visitors under the age of 54. Any visitor under the age of 35 must be accompanied by an adult.    COVID SWAB TESTING MUST BE COMPLETED ON:  04/02/21 @ 1:45 PM   Site: [facility] [address] [place] Enter: Main Entrance have a seat in the waiting area to the right of main entrance (DO NOT CHECK IN AT ADMITTING!!!!!) Dial: [phone] to alert staff you have arrived  You are not required to quarantine, however you are required to wear a well-fitted mask when you are out and around people not in your household.  Hand Hygiene often Do NOT share personal items Notify your provider if you are in close contact with someone who has COVID or you develop fever 100.4 or greater, new onset of sneezing, cough, sore throat, shortness of breath or body aches.   Your procedure is scheduled on: 04/05/21   Report to [facility] Main Entrance    Report to admitting at 7:30 AM   Call this number if you have problems the morning of surgery [phone]  MORNING OF SURGERY DRINK:   DRINK 1 G2 drink BEFORE YOU LEAVE HOME, DRINK ALL OF THE  G2 DRINK AT ONE TIME.   NO SOLID FOOD AFTER 600 PM THE NIGHT BEFORE YOUR SURGERY. YOU MAY DRINK CLEAR FLUIDS. THE G2 DRINK YOU DRINK BEFORE YOU LEAVE HOME WILL BE THE LAST FLUIDS YOU DRINK  BEFORE SURGERY.  PAIN IS EXPECTED AFTER SURGERY AND WILL NOT BE COMPLETELY ELIMINATED. AMBULATION AND TYLENOL WILL HELP REDUCE INCISIONAL AND GAS PAIN. MOVEMENT IS KEY!  YOU ARE EXPECTED TO BE OUT OF BED WITHIN 4 HOURS OF ADMISSION TO YOUR PATIENT ROOM.  SITTING IN THE RECLINER THROUGHOUT THE DAY IS IMPORTANT FOR DRINKING FLUIDS AND MOVING GAS THROUGHOUT THE GI TRACT.  COMPRESSION STOCKINGS SHOULD BE WORN [facility] STAY UNLESS YOU ARE WALKING.   INCENTIVE SPIROMETER SHOULD BE USED EVERY HOUR WHILE AWAKE TO DECREASE POST-OPERATIVE COMPLICATIONS SUCH AS PNEUMONIA.  WHEN DISCHARGED HOME, IT IS IMPORTANT TO CONTINUE TO WALK EVERY HOUR AND USE THE INCENTIVE SPIROMETER EVERY HOUR.    After Midnight you may have the following liquids until 6:45 AM DAY OF SURGERY  Water Black Coffee (sugar ok, NO MILK/CREAM OR CREAMERS)  Tea (sugar ok, NO MILK/CREAM OR CREAMERS) regular and decaf                             Plain Jell-O (NO RED)  Fruit ices (not with fruit pulp, NO RED)                                     Popsicles (NO RED)                                                                  Juice: apple, WHITE grape, WHITE cranberry Sports drinks like Gatorade (NO RED) Clear broth(vegetable,chicken,beef)     The day of surgery:  Drink ONE (1) Pre-Surgery G2 at 6:45 AM the morning of surgery. Drink in one sitting. Do not sip.  This drink was given to you during your hospital  pre-op appointment visit. Nothing else to drink after completing the  Pre-Surgery G2.          If you have questions, please contact your surgeons office.   FOLLOW BOWEL PREP AND ANY ADDITIONAL PRE OP INSTRUCTIONS YOU RECEIVED FROM YOUR SURGEON'S OFFICE!!!     Oral Hygiene is also important to reduce your risk of infection.                                    Remember - BRUSH YOUR TEETH THE MORNING OF SURGERY WITH YOUR REGULAR TOOTHPASTE   Stop all vitamins  and supplements 7 days before surgery.   Take these medicines the morning of surgery with A SIP OF WATER: Amlodipine, Bupropion, Buspirone, Carvedilol, Omeprazole                               You may not have any metal on your body including jewelry, and body piercing             Do not wear lotions, powders, cologne, or deodorant               Men may shave face and neck.   Do not bring valuables to the hospital. [facility] IS NOT             RESPONSIBLE   FOR VALUABLES.   Bring small overnight bag day of surgery.              Please read over the following fact sheets you were given: IF YOU HAVE QUESTIONS ABOUT YOUR PRE-OP INSTRUCTIONS PLEASE CALL [phone]- [facility] - Preparing for Surgery Before surgery, you can play an important role.  Because skin is not sterile, your skin needs to be as free of germs as possible.  You can reduce the number of germs on your skin by washing with CHG (chlorahexidine gluconate) soap before surgery.  CHG is an antiseptic cleaner which kills germs and bonds with the skin to continue killing germs even after washing. Please DO NOT use if you have an allergy to CHG or antibacterial soaps.  If your skin becomes reddened/irritated stop using the CHG and inform your nurse when you arrive at Short Stay. Do not shave (including legs and underarms) for at least 48 hours prior to the first CHG shower.  You may shave your face/neck.  Please follow these instructions carefully:  1.  Shower with CHG Soap the night before surgery and the  morning of surgery.  2.  If you choose to wash your hair, wash your hair first as usual with your normal  shampoo.  3.  After you shampoo, rinse your hair and body thoroughly to remove the shampoo.                             4.  Use CHG as you would any other liquid soap.  You can apply chg directly to the skin and wash.  Gently with a scrungie or clean washcloth.  5.  Apply the CHG Soap to your body ONLY FROM THE  NECK DOWN.   Do   not use on face/ open                           Wound or open sores. Avoid contact with eyes, ears mouth and   genitals (private parts).                       Wash face,  Genitals (private parts) with your normal soap.             6.  Wash thoroughly, paying special attention to the area where your    surgery  will be performed.  7.  Thoroughly rinse your body with warm water from the neck down.  8.  DO NOT shower/wash with your normal soap after using and rinsing off the CHG Soap.                9.  Pat yourself dry with a clean towel.            10.  Wear clean pajamas.            11.  Place clean sheets on your bed the night of your first shower and do not  sleep with pets. Day of Surgery : Do not apply any lotions/deodorants the morning of surgery.  Please wear clean clothes to the hospital/surgery center.  FAILURE TO FOLLOW THESE INSTRUCTIONS MAY RESULT IN THE CANCELLATION OF YOUR SURGERY  PATIENT SIGNATURE_________________________________  NURSE SIGNATURE__________________________________  ________________________________________________________________________   Kurt Gomez  An incentive spirometer is a tool that can help keep your lungs clear and active. This tool measures how well you are filling your lungs with each breath. Taking long deep breaths may help reverse or decrease the chance of developing breathing (pulmonary) problems (especially infection) following: A long period of time when you are unable to move or be active. BEFORE THE PROCEDURE  If the spirometer includes an indicator to show your best effort, your nurse or respiratory therapist will set it to a desired goal. If possible, sit up straight or lean slightly forward. Try not to slouch. Hold the incentive spirometer in an upright position. INSTRUCTIONS FOR USE  Sit on the edge of your bed if possible, or sit up as far as you can in bed or on a chair. Hold the incentive spirometer in  an upright position. Breathe out normally. Place the mouthpiece in your mouth and seal your lips tightly around it. Breathe in slowly and as deeply as possible, raising the piston or the ball toward the top of the column. Hold your breath for 3-5 seconds or for as long as possible. Allow the piston or ball  to fall to the bottom of the column. Remove the mouthpiece from your mouth and breathe out normally. Rest for a few seconds and repeat Steps 1 through 7 at least 10 times every 1-2 hours when you are awake. Take your time and take a few normal breaths between deep breaths. The spirometer may include an indicator to show your best effort. Use the indicator as a goal to work toward during each repetition. After each set of 10 deep breaths, practice coughing to be sure your lungs are clear. If you have an incision (the cut made at the time of surgery), support your incision when coughing by placing a pillow or rolled up towels firmly against it. Once you are able to get out of bed, walk around indoors and cough well. You may stop using the incentive spirometer when instructed by your caregiver.  RISKS AND COMPLICATIONS Take your time so you do not get dizzy or light-headed. If you are in pain, you may need to take or ask for pain medication before doing incentive spirometry. It is harder to take a deep breath if you are having pain. AFTER USE Rest and breathe slowly and easily. It can be helpful to keep track of a log of your progress. Your caregiver can provide you with a simple table to help with this. If you are using the spirometer at home, follow these instructions: SEEK MEDICAL CARE IF:  You are having difficultly using the spirometer. You have trouble using the spirometer as often as instructed. Your pain medication is not giving enough relief while using the spirometer. You develop fever of 100.5 F (38.1 C) or higher. SEEK IMMEDIATE MEDICAL CARE IF:  You cough up bloody sputum that  had not been present before. You develop fever of 102 F (38.9 C) or greater. You develop worsening pain at or near the incision site. MAKE SURE YOU:  Understand these instructions. Will watch your condition. Will get help right away if you are not doing well or get worse. Document Released: 05/30/2006 Document Revised: 04/11/2011 Document Reviewed: 07/31/2006 ExitCare Patient Information 2014 [employer], [place].   ________________________________________________________________________  WHAT IS A BLOOD TRANSFUSION? Blood Transfusion Information  A transfusion is the replacement of blood or some of its parts. Blood is made up of multiple cells which provide different functions. Red blood cells carry oxygen and are used for blood loss replacement. White blood cells fight against infection. Platelets control bleeding. Plasma helps clot blood. Other blood products are available for specialized needs, such as hemophilia or other clotting disorders. BEFORE THE TRANSFUSION  Who gives blood for transfusions?  Healthy volunteers who are fully evaluated to make sure their blood is safe. This is blood bank blood. Transfusion therapy is the safest it has ever been in the practice of medicine. Before blood is taken from a donor, a complete history is taken to make sure that person has no history of diseases nor engages in risky social behavior (examples are intravenous drug use or sexual activity with multiple partners). The donor's travel history is screened to minimize risk of transmitting infections, such as malaria. The donated blood is tested for signs of infectious diseases, such as HIV and hepatitis. The blood is then tested to be sure it is compatible with you in order to minimize the chance of a transfusion reaction. If you or a relative donates blood, this is often done in anticipation of surgery and is not appropriate for emergency situations. It takes many days to process the  donated  blood. RISKS AND COMPLICATIONS Although transfusion therapy is very safe and saves many lives, the main dangers of transfusion include:  Getting an infectious disease. Developing a transfusion reaction. This is an allergic reaction to something in the blood you were given. Every precaution is taken to prevent this. The decision to have a blood transfusion has been considered carefully by your caregiver before blood is given. Blood is not given unless the benefits outweigh the risks. AFTER THE TRANSFUSION Right after receiving a blood transfusion, you will usually feel much better and more energetic. This is especially true if your red blood cells have gotten low (anemic). The transfusion raises the level of the red blood cells which carry oxygen, and this usually causes an energy increase. The nurse administering the transfusion will monitor you carefully for complications. HOME CARE INSTRUCTIONS  No special instructions are needed after a transfusion. You may find your energy is better. Speak with your caregiver about any limitations on activity for underlying diseases you may have. SEEK MEDICAL CARE IF:  Your condition is not improving after your transfusion. You develop redness or irritation at the intravenous (IV) site. SEEK IMMEDIATE MEDICAL CARE IF:  Any of the following symptoms occur over the next 12 hours: Shaking chills. You have a temperature by mouth above 102 F (38.9 C), not controlled by medicine. Chest, back, or muscle pain. People around you feel you are not acting correctly or are confused. Shortness of breath or difficulty breathing. Dizziness and fainting. You get a rash or develop hives. You have a decrease in urine output. Your urine turns a dark color or changes to pink, red, or brown. Any of the following symptoms occur over the next 10 days: You have a temperature by mouth above 102 F (38.9 C), not controlled by medicine. Shortness of breath. Weakness after  normal activity. The white part of the eye turns yellow (jaundice). You have a decrease in the amount of urine or are urinating less often. Your urine turns a dark color or changes to pink, red, or brown. Document Released: 01/15/2000 Document Revised: 04/11/2011 Document Reviewed: 09/03/2007 [facility] Patient Information 2014 [place], [place].  _______________________________________________________________________

## 2021-03-30 NOTE — Progress Notes (Addendum)
COVID swab appointment: 04/01/21 @ 1345 ? ?COVID Vaccine Completed: yes x2 ?Date COVID Vaccine completed: 12/14/19 ?Has received booster: ?COVID vaccine manufacturer: Moderna    ? ?Date of COVID positive in last 90 days: no ? ?PCP - Burnard Hawthorne, PA ?Cardiologist - n/a ? ?Chest x-ray - 03/31/21 Epic ?EKG - 03/31/21 Epic/chart ?Stress Test - n/a ?ECHO - n/a ?Cardiac Cath - n/a ?Pacemaker/ICD device last checked: n/a ?Spinal Cord Stimulator: n/a ? ?Bowel Prep - clears after 6pm night before ? ?Sleep Study - yes, positive ?CPAP - yes, every night  ? ?Fasting Blood Sugar - pre DM, no checks ?Checks Blood Sugar _____ times a day ? ?Blood Thinner Instructions: n/a ?Aspirin Instructions: ?Last Dose: ? ?Activity level: Can go up a flight of stairs and perform activities of daily living without stopping and without symptoms of chest pain or shortness of breath. ?   ?Anesthesia review:  ? ?Patient denies shortness of breath, fever, cough and chest pain at PAT appointment ? ? ?Patient verbalized understanding of instructions that were given to them at the PAT appointment. Patient was also instructed that they will need to review over the PAT instructions again at home before surgery.  ?

## 2021-03-31 ENCOUNTER — Encounter (HOSPITAL_COMMUNITY): Payer: Self-pay

## 2021-03-31 ENCOUNTER — Other Ambulatory Visit: Payer: Self-pay

## 2021-03-31 ENCOUNTER — Encounter (HOSPITAL_COMMUNITY)
Admission: RE | Admit: 2021-03-31 | Discharge: 2021-03-31 | Disposition: A | Discharge status: Home / Self Care | Payer: No Typology Code available for payment source | Source: Ambulatory Visit | Attending: Surgery | Admitting: Surgery

## 2021-03-31 ENCOUNTER — Ambulatory Visit (HOSPITAL_COMMUNITY)
Admission: RE | Admit: 2021-03-31 | Discharge: 2021-03-31 | Disposition: A | Discharge status: Home / Self Care | Payer: No Typology Code available for payment source | Source: Ambulatory Visit | Attending: Anesthesiology | Admitting: Anesthesiology

## 2021-03-31 VITALS — BP 153/97 | HR 96 | Temp 98.4°F | Resp 14 | Ht 70.0 in | Wt 323.0 lb

## 2021-03-31 DIAGNOSIS — Z01818 Encounter for other preprocedural examination: Secondary | ICD-10-CM | POA: Diagnosis present

## 2021-03-31 DIAGNOSIS — R7303 Prediabetes: Secondary | ICD-10-CM

## 2021-03-31 HISTORY — DX: Prediabetes: R73.03

## 2021-03-31 HISTORY — DX: Sleep apnea, unspecified: G47.30

## 2021-03-31 HISTORY — DX: Family history of other specified conditions: Z84.89

## 2021-03-31 LAB — CBC WITH DIFFERENTIAL/PLATELET
Abs Immature Granulocytes: 0.02 10*3/uL (ref 0.00–0.07)
Basophils Absolute: 0.1 10*3/uL (ref 0.0–0.1)
Basophils Relative: 1 %
Eosinophils Absolute: 0.1 10*3/uL (ref 0.0–0.5)
Eosinophils Relative: 1 %
HCT: 43.4 % (ref 39.0–52.0)
Hemoglobin: 13.9 g/dL (ref 13.0–17.0)
Immature Granulocytes: 0 %
Lymphocytes Relative: 34 %
Lymphs Abs: 3 10*3/uL (ref 0.7–4.0)
MCH: 26.9 pg (ref 26.0–34.0)
MCHC: 32 g/dL (ref 30.0–36.0)
MCV: 84.1 fL (ref 80.0–100.0)
Monocytes Absolute: 0.8 10*3/uL (ref 0.1–1.0)
Monocytes Relative: 8 %
Neutro Abs: 5 10*3/uL (ref 1.7–7.7)
Neutrophils Relative %: 56 %
Platelets: 429 10*3/uL — ABNORMAL HIGH (ref 150–400)
RBC: 5.16 MIL/uL (ref 4.22–5.81)
RDW: 14.9 % (ref 11.5–15.5)
WBC: 9.1 10*3/uL (ref 4.0–10.5)
nRBC: 0 % (ref 0.0–0.2)

## 2021-03-31 LAB — HEMOGLOBIN A1C
Hgb A1c MFr Bld: 5.2 % (ref 4.8–5.6)
Mean Plasma Glucose: 102.54 mg/dL

## 2021-03-31 LAB — COMPREHENSIVE METABOLIC PANEL
ALT: 43 U/L (ref 0–44)
AST: 24 U/L (ref 15–41)
Albumin: 4.3 g/dL (ref 3.5–5.0)
Alkaline Phosphatase: 70 U/L (ref 38–126)
Anion gap: 11 (ref 5–15)
BUN: 29 mg/dL — ABNORMAL HIGH (ref 6–20)
CO2: 23 mmol/L (ref 22–32)
Calcium: 9.8 mg/dL (ref 8.9–10.3)
Chloride: 103 mmol/L (ref 98–111)
Creatinine, Ser: 1.19 mg/dL (ref 0.61–1.24)
GFR, Estimated: >60 mL/min (ref >60)
Glucose, Bld: 107 mg/dL — ABNORMAL HIGH (ref 70–99)
Potassium: 3.7 mmol/L (ref 3.5–5.1)
Sodium: 137 mmol/L (ref 135–145)
Total Bilirubin: 0.2 mg/dL — ABNORMAL LOW (ref 0.3–1.2)
Total Protein: 7.8 g/dL (ref 6.5–8.1)

## 2021-03-31 LAB — GLUCOSE, CAPILLARY: Glucose-Capillary: 113 mg/dL — ABNORMAL HIGH (ref 70–99)

## 2021-04-02 ENCOUNTER — Other Ambulatory Visit: Payer: Self-pay | Admitting: Medical

## 2021-04-02 ENCOUNTER — Other Ambulatory Visit: Payer: Self-pay

## 2021-04-02 ENCOUNTER — Encounter (HOSPITAL_COMMUNITY)
Admission: RE | Admit: 2021-04-02 | Discharge: 2021-04-02 | Disposition: A | Discharge status: Home / Self Care | Payer: No Typology Code available for payment source | Source: Ambulatory Visit | Attending: Surgery | Admitting: Surgery

## 2021-04-02 DIAGNOSIS — Z01812 Encounter for preprocedural laboratory examination: Secondary | ICD-10-CM | POA: Insufficient documentation

## 2021-04-02 DIAGNOSIS — Z20822 Contact with and (suspected) exposure to covid-19: Secondary | ICD-10-CM | POA: Insufficient documentation

## 2021-04-02 DIAGNOSIS — Z01818 Encounter for other preprocedural examination: Secondary | ICD-10-CM

## 2021-04-03 LAB — SARS CORONAVIRUS 2 (TAT 6-24 HRS): SARS Coronavirus 2: NEGATIVE

## 2021-04-05 ENCOUNTER — Inpatient Hospital Stay (HOSPITAL_COMMUNITY): Payer: No Typology Code available for payment source | Admitting: Anesthesiology

## 2021-04-05 ENCOUNTER — Encounter (HOSPITAL_COMMUNITY)
Admission: RE | Disposition: A | Discharge status: Home / Self Care | Payer: Self-pay | Source: Home / Self Care | Attending: Surgery

## 2021-04-05 ENCOUNTER — Other Ambulatory Visit: Payer: Self-pay

## 2021-04-05 ENCOUNTER — Encounter (HOSPITAL_COMMUNITY): Payer: Self-pay | Admitting: Surgery

## 2021-04-05 ENCOUNTER — Inpatient Hospital Stay (HOSPITAL_COMMUNITY)
Admission: RE | Admit: 2021-04-05 | Discharge: 2021-04-06 | DRG: 621 | Disposition: A | Discharge status: Home / Self Care | Payer: No Typology Code available for payment source | Attending: Surgery | Admitting: Surgery

## 2021-04-05 DIAGNOSIS — Z818 Family history of other mental and behavioral disorders: Secondary | ICD-10-CM

## 2021-04-05 DIAGNOSIS — K219 Gastro-esophageal reflux disease without esophagitis: Secondary | ICD-10-CM | POA: Diagnosis present

## 2021-04-05 DIAGNOSIS — F418 Other specified anxiety disorders: Secondary | ICD-10-CM | POA: Diagnosis present

## 2021-04-05 DIAGNOSIS — Z20822 Contact with and (suspected) exposure to covid-19: Secondary | ICD-10-CM | POA: Diagnosis present

## 2021-04-05 DIAGNOSIS — Z79899 Other long term (current) drug therapy: Secondary | ICD-10-CM | POA: Diagnosis not present

## 2021-04-05 DIAGNOSIS — Z87891 Personal history of nicotine dependence: Secondary | ICD-10-CM | POA: Diagnosis not present

## 2021-04-05 DIAGNOSIS — K449 Diaphragmatic hernia without obstruction or gangrene: Secondary | ICD-10-CM | POA: Diagnosis present

## 2021-04-05 DIAGNOSIS — Z6841 Body Mass Index (BMI) 40.0 and over, adult: Secondary | ICD-10-CM | POA: Diagnosis not present

## 2021-04-05 DIAGNOSIS — I10 Essential (primary) hypertension: Secondary | ICD-10-CM | POA: Diagnosis present

## 2021-04-05 DIAGNOSIS — Z833 Family history of diabetes mellitus: Secondary | ICD-10-CM | POA: Diagnosis not present

## 2021-04-05 DIAGNOSIS — K760 Fatty (change of) liver, not elsewhere classified: Secondary | ICD-10-CM | POA: Diagnosis present

## 2021-04-05 DIAGNOSIS — E785 Hyperlipidemia, unspecified: Secondary | ICD-10-CM | POA: Diagnosis present

## 2021-04-05 DIAGNOSIS — E66813 Obesity, class 3: Secondary | ICD-10-CM | POA: Diagnosis present

## 2021-04-05 DIAGNOSIS — G4733 Obstructive sleep apnea (adult) (pediatric): Secondary | ICD-10-CM | POA: Diagnosis present

## 2021-04-05 DIAGNOSIS — E661 Drug-induced obesity: Secondary | ICD-10-CM | POA: Diagnosis present

## 2021-04-05 DIAGNOSIS — R7303 Prediabetes: Secondary | ICD-10-CM | POA: Diagnosis present

## 2021-04-05 DIAGNOSIS — Z8249 Family history of ischemic heart disease and other diseases of the circulatory system: Secondary | ICD-10-CM | POA: Diagnosis not present

## 2021-04-05 HISTORY — PX: LAPAROSCOPIC GASTRIC SLEEVE RESECTION: SHX5895

## 2021-04-05 HISTORY — PX: UPPER GI ENDOSCOPY: SHX6162

## 2021-04-05 LAB — TYPE AND SCREEN
ABO/RH(D): A POS
Antibody Screen: NEGATIVE

## 2021-04-05 LAB — GLUCOSE, CAPILLARY
Glucose-Capillary: 96 mg/dL (ref 70–99)
Glucose-Capillary: 107 mg/dL — ABNORMAL HIGH (ref 70–99)

## 2021-04-05 LAB — ABO/RH: ABO/RH(D): A POS

## 2021-04-05 SURGERY — GASTRECTOMY, SLEEVE, LAPAROSCOPIC
Anesthesia: General | Site: Esophagus

## 2021-04-05 MED ORDER — ROCURONIUM BROMIDE 10 MG/ML (PF) SYRINGE
PREFILLED_SYRINGE | INTRAVENOUS | Status: AC
  Filled 2021-04-05: qty 10

## 2021-04-05 MED ORDER — BUPROPION HCL ER (XL) 300 MG PO TB24
300.0000 mg | ORAL_TABLET | Freq: Every day | ORAL | Status: DC
  Administered 2021-04-06: 300 mg via ORAL
  Filled 2021-04-05: qty 1

## 2021-04-05 MED ORDER — ACETAMINOPHEN 500 MG PO TABS
1000.0000 mg | ORAL_TABLET | ORAL | Status: AC
  Administered 2021-04-05: 1000 mg via ORAL
  Filled 2021-04-05: qty 2

## 2021-04-05 MED ORDER — METHOCARBAMOL 1000 MG/10ML IJ SOLN
500.0000 mg | Freq: Four times a day (QID) | INTRAMUSCULAR | Status: DC | PRN
  Filled 2021-04-05: qty 5

## 2021-04-05 MED ORDER — HYDROMORPHONE HCL 1 MG/ML IJ SOLN
INTRAMUSCULAR | Status: AC
  Administered 2021-04-05: 0.5 mg via INTRAVENOUS
  Filled 2021-04-05: qty 1

## 2021-04-05 MED ORDER — PROPOFOL 10 MG/ML IV BOLUS
INTRAVENOUS | Status: AC
  Filled 2021-04-05: qty 20

## 2021-04-05 MED ORDER — LIDOCAINE HCL 2 % IJ SOLN
INTRAMUSCULAR | Status: AC
  Filled 2021-04-05: qty 20

## 2021-04-05 MED ORDER — METOCLOPRAMIDE HCL 5 MG/ML IJ SOLN
10.0000 mg | Freq: Four times a day (QID) | INTRAMUSCULAR | Status: DC
  Administered 2021-04-05 – 2021-04-06 (×4): 10 mg via INTRAVENOUS
  Filled 2021-04-05 (×4): qty 2

## 2021-04-05 MED ORDER — SODIUM CHLORIDE 0.9 % IV SOLN: INTRAVENOUS | Status: DC

## 2021-04-05 MED ORDER — HYDRALAZINE HCL 20 MG/ML IJ SOLN: 10.0000 mg | INTRAMUSCULAR | Status: DC | PRN

## 2021-04-05 MED ORDER — LIDOCAINE 2% (20 MG/ML) 5 ML SYRINGE
INTRAMUSCULAR | Status: DC | PRN
  Administered 2021-04-05: 1.5 mg/kg/h via INTRAVENOUS

## 2021-04-05 MED ORDER — GABAPENTIN 100 MG PO CAPS
200.0000 mg | ORAL_CAPSULE | Freq: Two times a day (BID) | ORAL | Status: DC
  Administered 2021-04-05 – 2021-04-06 (×2): 200 mg via ORAL
  Filled 2021-04-05 (×2): qty 2

## 2021-04-05 MED ORDER — TRAMADOL HCL 50 MG PO TABS
50.0000 mg | ORAL_TABLET | Freq: Four times a day (QID) | ORAL | Status: DC | PRN
  Administered 2021-04-05: 50 mg via ORAL
  Filled 2021-04-05: qty 1

## 2021-04-05 MED ORDER — BUPIVACAINE LIPOSOME 1.3 % IJ SUSP: 20.0000 mL | Freq: Once | INTRAMUSCULAR | Status: DC

## 2021-04-05 MED ORDER — ONDANSETRON HCL 4 MG/2ML IJ SOLN
INTRAMUSCULAR | Status: AC
  Filled 2021-04-05: qty 2

## 2021-04-05 MED ORDER — STERILE WATER FOR IRRIGATION IR SOLN
Status: DC | PRN
  Administered 2021-04-05: 1000 mL

## 2021-04-05 MED ORDER — SUCCINYLCHOLINE CHLORIDE 200 MG/10ML IV SOSY
PREFILLED_SYRINGE | INTRAVENOUS | Status: AC
  Filled 2021-04-05: qty 10

## 2021-04-05 MED ORDER — ACETAMINOPHEN 160 MG/5ML PO SOLN: 1000.0000 mg | Freq: Three times a day (TID) | ORAL | Status: DC

## 2021-04-05 MED ORDER — ENOXAPARIN SODIUM 30 MG/0.3ML IJ SOSY
30.0000 mg | PREFILLED_SYRINGE | Freq: Two times a day (BID) | INTRAMUSCULAR | Status: DC
  Administered 2021-04-05 – 2021-04-06 (×2): 30 mg via SUBCUTANEOUS
  Filled 2021-04-05 (×2): qty 0.3

## 2021-04-05 MED ORDER — ONDANSETRON HCL 4 MG/2ML IJ SOLN: 4.0000 mg | INTRAMUSCULAR | Status: DC | PRN

## 2021-04-05 MED ORDER — PANTOPRAZOLE SODIUM 40 MG IV SOLR
40.0000 mg | Freq: Every day | INTRAVENOUS | Status: DC
  Administered 2021-04-05: 40 mg via INTRAVENOUS
  Filled 2021-04-05: qty 10

## 2021-04-05 MED ORDER — CARVEDILOL 6.25 MG PO TABS
6.2500 mg | ORAL_TABLET | Freq: Two times a day (BID) | ORAL | Status: DC
Start: 2021-04-05 — End: 2021-04-06
  Administered 2021-04-05 – 2021-04-06 (×2): 6.25 mg via ORAL
  Filled 2021-04-05 (×2): qty 1

## 2021-04-05 MED ORDER — CHLORHEXIDINE GLUCONATE 4 % EX LIQD: 60.0000 mL | Freq: Once | CUTANEOUS | Status: DC

## 2021-04-05 MED ORDER — ONDANSETRON HCL 4 MG/2ML IJ SOLN
INTRAMUSCULAR | Status: DC | PRN
  Administered 2021-04-05: 4 mg via INTRAVENOUS

## 2021-04-05 MED ORDER — HYDROMORPHONE HCL 1 MG/ML IJ SOLN: 0.5000 mg | INTRAMUSCULAR | Status: DC | PRN

## 2021-04-05 MED ORDER — SUGAMMADEX SODIUM 500 MG/5ML IV SOLN
INTRAVENOUS | Status: DC | PRN
  Administered 2021-04-05: 400 mg via INTRAVENOUS

## 2021-04-05 MED ORDER — MIDAZOLAM HCL 2 MG/2ML IJ SOLN
INTRAMUSCULAR | Status: DC | PRN
  Administered 2021-04-05: 2 mg via INTRAVENOUS

## 2021-04-05 MED ORDER — OXYCODONE HCL 5 MG/5ML PO SOLN
5.0000 mg | Freq: Four times a day (QID) | ORAL | Status: DC | PRN
  Administered 2021-04-05: 5 mg via ORAL
  Filled 2021-04-05: qty 5

## 2021-04-05 MED ORDER — SIMETHICONE 80 MG PO CHEW: 80.0000 mg | CHEWABLE_TABLET | Freq: Four times a day (QID) | ORAL | Status: DC | PRN

## 2021-04-05 MED ORDER — METOPROLOL TARTRATE 5 MG/5ML IV SOLN: 5.0000 mg | Freq: Four times a day (QID) | INTRAVENOUS | Status: DC | PRN

## 2021-04-05 MED ORDER — LACTATED RINGERS IV SOLN: INTRAVENOUS | Status: DC

## 2021-04-05 MED ORDER — EPHEDRINE 5 MG/ML INJ
INTRAVENOUS | Status: AC
  Filled 2021-04-05: qty 5

## 2021-04-05 MED ORDER — SUCCINYLCHOLINE CHLORIDE 200 MG/10ML IV SOSY
PREFILLED_SYRINGE | INTRAVENOUS | Status: DC | PRN
  Administered 2021-04-05: 140 mg via INTRAVENOUS

## 2021-04-05 MED ORDER — APREPITANT 40 MG PO CAPS
40.0000 mg | ORAL_CAPSULE | ORAL | Status: AC
  Administered 2021-04-05: 40 mg via ORAL
  Filled 2021-04-05: qty 1

## 2021-04-05 MED ORDER — SODIUM CHLORIDE 0.9 % IV SOLN
2.0000 g | INTRAVENOUS | Status: AC
  Administered 2021-04-05: 2 g via INTRAVENOUS
  Filled 2021-04-05: qty 2

## 2021-04-05 MED ORDER — HEPARIN SODIUM (PORCINE) 5000 UNIT/ML IJ SOLN
5000.0000 [IU] | INTRAMUSCULAR | Status: AC
  Administered 2021-04-05: 5000 [IU] via SUBCUTANEOUS
  Filled 2021-04-05: qty 1

## 2021-04-05 MED ORDER — PROPOFOL 10 MG/ML IV BOLUS
INTRAVENOUS | Status: DC | PRN
  Administered 2021-04-05: 200 mg via INTRAVENOUS

## 2021-04-05 MED ORDER — BUSPIRONE HCL 5 MG PO TABS
15.0000 mg | ORAL_TABLET | Freq: Two times a day (BID) | ORAL | Status: DC
  Administered 2021-04-05 – 2021-04-06 (×2): 15 mg via ORAL
  Filled 2021-04-05 (×2): qty 3

## 2021-04-05 MED ORDER — GABAPENTIN 300 MG PO CAPS
300.0000 mg | ORAL_CAPSULE | ORAL | Status: AC
  Administered 2021-04-05: 300 mg via ORAL
  Filled 2021-04-05: qty 1

## 2021-04-05 MED ORDER — HYDROMORPHONE HCL 1 MG/ML IJ SOLN
0.2500 mg | INTRAMUSCULAR | Status: DC | PRN
  Administered 2021-04-05 (×2): 0.5 mg via INTRAVENOUS

## 2021-04-05 MED ORDER — AMLODIPINE BESYLATE 10 MG PO TABS
10.0000 mg | ORAL_TABLET | Freq: Every day | ORAL | Status: DC
Start: 2021-04-05 — End: 2021-04-06
  Administered 2021-04-06: 10 mg via ORAL
  Filled 2021-04-05: qty 1

## 2021-04-05 MED ORDER — BUPIVACAINE LIPOSOME 1.3 % IJ SUSP
INTRAMUSCULAR | Status: AC
  Filled 2021-04-05: qty 20

## 2021-04-05 MED ORDER — ENSURE MAX PROTEIN PO LIQD
2.0000 [oz_av] | ORAL | Status: DC
  Administered 2021-04-06 (×2): 2 [oz_av] via ORAL

## 2021-04-05 MED ORDER — ROCURONIUM BROMIDE 10 MG/ML (PF) SYRINGE
PREFILLED_SYRINGE | INTRAVENOUS | Status: DC | PRN
Start: 2021-04-05 — End: 2021-04-05
  Administered 2021-04-05: 20 mg via INTRAVENOUS
  Administered 2021-04-05: 70 mg via INTRAVENOUS

## 2021-04-05 MED ORDER — DOCUSATE SODIUM 100 MG PO CAPS
100.0000 mg | ORAL_CAPSULE | Freq: Two times a day (BID) | ORAL | Status: DC
  Administered 2021-04-05 – 2021-04-06 (×2): 100 mg via ORAL
  Filled 2021-04-05 (×2): qty 1

## 2021-04-05 MED ORDER — BUPIVACAINE-EPINEPHRINE 0.25% -1:200000 IJ SOLN
INTRAMUSCULAR | Status: DC | PRN
  Administered 2021-04-05: 30 mL

## 2021-04-05 MED ORDER — ACETAMINOPHEN 500 MG PO TABS
1000.0000 mg | ORAL_TABLET | Freq: Three times a day (TID) | ORAL | Status: DC
  Administered 2021-04-05 – 2021-04-06 (×2): 1000 mg via ORAL
  Filled 2021-04-05 (×2): qty 2

## 2021-04-05 MED ORDER — BUPIVACAINE LIPOSOME 1.3 % IJ SUSP
INTRAMUSCULAR | Status: DC | PRN
  Administered 2021-04-05: 20 mL

## 2021-04-05 MED ORDER — CHLORHEXIDINE GLUCONATE 0.12 % MT SOLN
15.0000 mL | Freq: Once | OROMUCOSAL | Status: AC
  Administered 2021-04-05: 15 mL via OROMUCOSAL

## 2021-04-05 MED ORDER — FENTANYL CITRATE (PF) 250 MCG/5ML IJ SOLN
INTRAMUSCULAR | Status: AC
  Filled 2021-04-05: qty 5

## 2021-04-05 MED ORDER — MIDAZOLAM HCL 2 MG/2ML IJ SOLN
INTRAMUSCULAR | Status: AC
  Filled 2021-04-05: qty 2

## 2021-04-05 MED ORDER — SCOPOLAMINE 1 MG/3DAYS TD PT72
1.0000 | MEDICATED_PATCH | TRANSDERMAL | Status: DC
  Administered 2021-04-05: 1.5 mg via TRANSDERMAL
  Filled 2021-04-05: qty 1

## 2021-04-05 MED ORDER — DEXAMETHASONE SODIUM PHOSPHATE 10 MG/ML IJ SOLN
INTRAMUSCULAR | Status: DC | PRN
  Administered 2021-04-05: 10 mg via INTRAVENOUS

## 2021-04-05 MED ORDER — 0.9 % SODIUM CHLORIDE (POUR BTL) OPTIME
TOPICAL | Status: DC | PRN
  Administered 2021-04-05: 1000 mL

## 2021-04-05 MED ORDER — LIDOCAINE HCL (PF) 2 % IJ SOLN
INTRAMUSCULAR | Status: AC
  Filled 2021-04-05: qty 5

## 2021-04-05 MED ORDER — DEXAMETHASONE SODIUM PHOSPHATE 10 MG/ML IJ SOLN
INTRAMUSCULAR | Status: AC
  Filled 2021-04-05: qty 1

## 2021-04-05 MED ORDER — HYDROMORPHONE HCL 1 MG/ML IJ SOLN: 0.2500 mg | INTRAMUSCULAR | Status: DC | PRN

## 2021-04-05 MED ORDER — DEXTROSE 5 % IV SOLN
3.0000 g | INTRAVENOUS | Status: DC
  Filled 2021-04-05: qty 3

## 2021-04-05 MED ORDER — FENTANYL CITRATE (PF) 250 MCG/5ML IJ SOLN
INTRAMUSCULAR | Status: DC | PRN
  Administered 2021-04-05 (×2): 50 ug via INTRAVENOUS
  Administered 2021-04-05: 150 ug via INTRAVENOUS

## 2021-04-05 MED ORDER — EPHEDRINE SULFATE-NACL 50-0.9 MG/10ML-% IV SOSY
PREFILLED_SYRINGE | INTRAVENOUS | Status: DC | PRN
  Administered 2021-04-05: 10 mg via INTRAVENOUS

## 2021-04-05 MED ORDER — BUPIVACAINE-EPINEPHRINE (PF) 0.25% -1:200000 IJ SOLN
INTRAMUSCULAR | Status: AC
  Filled 2021-04-05: qty 30

## 2021-04-05 MED ORDER — LACTATED RINGERS IR SOLN
Status: DC | PRN
  Administered 2021-04-05: 1000 mL

## 2021-04-05 MED ORDER — LIDOCAINE 2% (20 MG/ML) 5 ML SYRINGE
INTRAMUSCULAR | Status: DC | PRN
Start: 2021-04-05 — End: 2021-04-05
  Administered 2021-04-05: 100 mg via INTRAVENOUS

## 2021-04-05 MED ORDER — ORAL CARE MOUTH RINSE: 15.0000 mL | Freq: Once | OROMUCOSAL | Status: AC

## 2021-04-05 MED ORDER — SUGAMMADEX SODIUM 500 MG/5ML IV SOLN
INTRAVENOUS | Status: AC
  Filled 2021-04-05: qty 5

## 2021-04-05 SURGICAL SUPPLY — 71 items
APPLIER CLIP ROT 10 11.4 M/L (STAPLE)
APPLIER CLIP ROT 13.4 12 LRG (CLIP)
BAG COUNTER SPONGE SURGICOUNT (BAG) IMPLANT
BAG LAPAROSCOPIC 12 15 PORT 16 (BASKET) IMPLANT
BAG RETRIEVAL 12/15 (BASKET)
BENZOIN TINCTURE PRP APPL 2/3 (GAUZE/BANDAGES/DRESSINGS) ×3 IMPLANT
BLADE SURG SZ11 CARB STEEL (BLADE) ×3 IMPLANT
BNDG ADH 1X3 SHEER STRL LF (GAUZE/BANDAGES/DRESSINGS) ×13 IMPLANT
CABLE HIGH FREQUENCY MONO STRZ (ELECTRODE) ×3 IMPLANT
CHLORAPREP W/TINT 26 (MISCELLANEOUS) ×6 IMPLANT
CLIP APPLIE ROT 10 11.4 M/L (STAPLE) IMPLANT
CLIP APPLIE ROT 13.4 12 LRG (CLIP) IMPLANT
COVER SURGICAL LIGHT HANDLE (MISCELLANEOUS) ×3 IMPLANT
DEVICE SUT QUICK LOAD TK 5 (STAPLE) ×1 IMPLANT
DEVICE SUT TI-KNOT TK 5X26 (MISCELLANEOUS) ×1 IMPLANT
DRAPE UTILITY XL STRL (DRAPES) ×6 IMPLANT
ELECT REM PT RETURN 15FT ADLT (MISCELLANEOUS) ×3 IMPLANT
GAUZE SPONGE 4X4 12PLY STRL (GAUZE/BANDAGES/DRESSINGS) IMPLANT
GLOVE SURG ENC MOIS LTX SZ6 (GLOVE) ×3 IMPLANT
GLOVE SURG MICRO LTX SZ6 (GLOVE) ×3 IMPLANT
GLOVE SURG UNDER LTX SZ6.5 (GLOVE) ×3 IMPLANT
GOWN STRL REUS W/ TWL LRG LVL3 (GOWN DISPOSABLE) IMPLANT
GOWN STRL REUS W/ TWL XL LVL3 (GOWN DISPOSABLE) IMPLANT
GOWN STRL REUS W/TWL LRG LVL3 (GOWN DISPOSABLE) ×3 IMPLANT
GOWN STRL REUS W/TWL XL LVL3 (GOWN DISPOSABLE) ×6 IMPLANT
GRASPER SUT TROCAR 14GX15 (MISCELLANEOUS) ×3 IMPLANT
IRRIG SUCT STRYKERFLOW 2 WTIP (MISCELLANEOUS) ×3
IRRIGATION SUCT STRKRFLW 2 WTP (MISCELLANEOUS) ×2 IMPLANT
KIT BASIN OR (CUSTOM PROCEDURE TRAY) ×3 IMPLANT
KIT TURNOVER KIT A (KITS) IMPLANT
MARKER SKIN DUAL TIP RULER LAB (MISCELLANEOUS) ×3 IMPLANT
MAT PREVALON FULL STRYKER (MISCELLANEOUS) ×3 IMPLANT
NDL SPNL 22GX3.5 QUINCKE BK (NEEDLE) ×2 IMPLANT
NEEDLE SPNL 22GX3.5 QUINCKE BK (NEEDLE) ×3 IMPLANT
PACK UNIVERSAL I (CUSTOM PROCEDURE TRAY) ×3 IMPLANT
RELOAD ENDO STITCH (ENDOMECHANICALS) ×3 IMPLANT
RELOAD STAPLE 60 3.6 BLU REG (STAPLE) ×2 IMPLANT
RELOAD STAPLE 60 3.8 GOLD REG (STAPLE) ×2 IMPLANT
RELOAD STAPLE 60 4.1 GRN THCK (STAPLE) IMPLANT
RELOAD STAPLER BLUE 60MM (STAPLE) ×6 IMPLANT
RELOAD STAPLER GOLD 60MM (STAPLE) ×2 IMPLANT
RELOAD STAPLER GREEN 60MM (STAPLE) ×2 IMPLANT
RELOAD SUT TRIPLE-STITCH 2-0 (ENDOMECHANICALS) IMPLANT
SCISSORS LAP 5X45 EPIX DISP (ENDOMECHANICALS) ×3 IMPLANT
SET TUBE SMOKE EVAC HIGH FLOW (TUBING) ×3 IMPLANT
SHEARS HARMONIC ACE PLUS 45CM (MISCELLANEOUS) ×3 IMPLANT
SLEEVE ADV FIXATION 5X100MM (TROCAR) ×6 IMPLANT
SLEEVE GASTRECTOMY 40FR VISIGI (MISCELLANEOUS) ×3 IMPLANT
SOL ANTI FOG 6CC (MISCELLANEOUS) ×2 IMPLANT
SOLUTION ANTI FOG 6CC (MISCELLANEOUS) ×1
SPIKE FLUID TRANSFER (MISCELLANEOUS) ×3 IMPLANT
SPONGE T-LAP 18X18 ~~LOC~~+RFID (SPONGE) ×3 IMPLANT
STAPLE LINE REINFORCEMENT LAP (STAPLE) ×7 IMPLANT
STAPLER ECHELON LONG 60 440 (INSTRUMENTS) ×3 IMPLANT
STAPLER RELOAD BLUE 60MM (STAPLE) ×9
STAPLER RELOAD GOLD 60MM (STAPLE) ×3
STAPLER RELOAD GREEN 60MM (STAPLE) ×3
STRIP CLOSURE SKIN 1/2X4 (GAUZE/BANDAGES/DRESSINGS) ×3 IMPLANT
SUT MNCRL AB 4-0 PS2 18 (SUTURE) ×3 IMPLANT
SUT SURGIDAC NAB ES-9 0 48 120 (SUTURE) ×1 IMPLANT
SUT VICRYL 0 TIES 12 18 (SUTURE) ×3 IMPLANT
SYR 10ML ECCENTRIC (SYRINGE) ×3 IMPLANT
SYR 20ML LL LF (SYRINGE) ×3 IMPLANT
SYR 50ML LL SCALE MARK (SYRINGE) ×3 IMPLANT
TOWEL OR 17X26 10 PK STRL BLUE (TOWEL DISPOSABLE) ×3 IMPLANT
TOWEL OR NON WOVEN STRL DISP B (DISPOSABLE) ×3 IMPLANT
TROCAR ADV FIXATION 5X100MM (TROCAR) ×3 IMPLANT
TROCAR BLADELESS 15MM (ENDOMECHANICALS) ×3 IMPLANT
TROCAR BLADELESS OPT 5 100 (ENDOMECHANICALS) ×3 IMPLANT
TUBING CONNECTING 10 (TUBING) ×3 IMPLANT
TUBING ENDO SMARTCAP (MISCELLANEOUS) ×3 IMPLANT

## 2021-04-05 NOTE — Anesthesia Preprocedure Evaluation (Signed)
Anesthesia Evaluation  ?Patient identified by MRN, date of birth, ID band ?Patient awake ? ? ? ?Reviewed: ?Allergy & Precautions, NPO status , Patient's Chart, lab work & pertinent test results ? ?Airway ?Mallampati: II ? ?TM Distance: >3 FB ? ? ? ? Dental ?  ?Pulmonary ?sleep apnea , former smoker,  ?  ?breath sounds clear to auscultation ? ? ? ? ? ? Cardiovascular ?hypertension,  ?Rhythm:Regular Rate:Normal ? ? ?  ?Neuro/Psych ?PSYCHIATRIC DISORDERS   ? GI/Hepatic ?Neg liver ROS, GERD  ,  ?Endo/Other  ? ? Renal/GU ?Renal disease  ? ?  ?Musculoskeletal ? ? Abdominal ?  ?Peds ? Hematology ?  ?Anesthesia Other Findings ? ? Reproductive/Obstetrics ? ?  ? ? ? ? ? ? ? ? ? ? ? ? ? ?  ?  ? ? ? ? ? ? ? ? ?Anesthesia Physical ?Anesthesia Plan ? ?ASA: 3 ? ?Anesthesia Plan: General  ? ?Post-op Pain Management:   ? ?Induction: Intravenous ? ?PONV Risk Score and Plan: 3 and Ondansetron, Dexamethasone and Midazolam ? ?Airway Management Planned:  ? ?Additional Equipment:  ? ?Intra-op Plan:  ? ?Post-operative Plan: Possible Post-op intubation/ventilation ? ?Informed Consent: I have reviewed the patients History and Physical, chart, labs and discussed the procedure including the risks, benefits and alternatives for the proposed anesthesia with the patient or authorized representative who has indicated his/her understanding and acceptance.  ? ? ? ?Dental advisory given ? ?Plan Discussed with: CRNA and Anesthesiologist ? ?Anesthesia Plan Comments:   ? ? ? ? ? ? ?Anesthesia Quick Evaluation ? ?

## 2021-04-05 NOTE — Anesthesia Postprocedure Evaluation (Signed)
Anesthesia Post Note ? ?Patient: Kurt Gomez ? ?Procedure(s) Performed: LAPAROSCOPIC GASTRIC SLEEVE RESECTION WITH HIATAL HERNIA REPAIR (Abdomen) ?UPPER GI ENDOSCOPY (Esophagus) ? ?  ? ?Patient location during evaluation: PACU ?Anesthesia Type: General ?Level of consciousness: awake ?Pain management: pain level controlled ?Respiratory status: spontaneous breathing ?Cardiovascular status: stable ?Postop Assessment: no apparent nausea or vomiting ?Anesthetic complications: no ? ? ?No notable events documented. ? ?Last Vitals:  ?Vitals:  ? 04/05/21 1525 04/05/21 1619  ?BP: (!) 160/88 136/79  ?Pulse: 87 76  ?Resp: 16 16  ?Temp: 36.7 ?C 36.7 ?C  ?SpO2: 100% 100%  ?  ?Last Pain:  ?Vitals:  ? 04/05/21 1318  ?TempSrc: Oral  ?PainSc:   ? ? ?  ?  ?  ?  ?  ?  ? ?Jeyson Deshotel ? ? ? ? ?

## 2021-04-05 NOTE — Progress Notes (Signed)

## 2021-04-05 NOTE — Op Note (Signed)
Kurt Gomez ?[id number] ?08/08/1985 ?04/05/2021 ? ?Preoperative diagnosis: severe obesity ? ?Postoperative diagnosis: Same  ? ?Procedure: upper endoscopy  ? ?Surgeon: Mary Sella. Enda Santo M.D., FACS  ? ?Anesthesia: Gen.  ? ?Indications for procedure: 36 y.o. year old male undergoing Laparoscopic Gastric Sleeve Resection and an EGD was requested to evaluate the new gastric sleeve.  ? ?Description of procedure: After we have completed the sleeve resection, I scrubbed out and obtained the Olympus endoscope. I gently placed endoscope in the patient's oropharynx and gently glided it down the esophagus without any difficulty under direct visualization. Once I was in the gastric sleeve, I insufflated the stomach with air. I was able to cannulate and advanced the scope through the gastric sleeve. I was able to cannulate the duodenum with ease. Dr. Fredricka Bonine had placed saline in the upper abdomen. Upon further insufflation of the gastric sleeve there was no evidence of bubbles. GE junction located at 39 cm.  Upon further inspection of the gastric sleeve, the mucosa appeared normal. There is no evidence of any mucosal abnormality. The sleeve was widely patent at the angularis. There was no evidence of bleeding. The gastric sleeve was decompressed. The scope was withdrawn. The patient tolerated this portion of the procedure well. Please see Dr Derrill Memo operative note for details regarding the laparoscopic gastric sleeve resection.  ? ?Mary Sella. Andrey Campanile, MD, FACS  ?General, Bariatric, & Minimally Invasive Surgery  ?[facility], [place] ? ?

## 2021-04-05 NOTE — Progress Notes (Signed)
PHARMACY CONSULT FOR:  Risk Assessment for Post-Discharge VTE Following Bariatric Surgery ? ?Post-Discharge VTE Risk Assessment: ?This patient's probability of 30-day post-discharge VTE is increased due to the factors marked: ?X Sleeve gastrectomy  ? Liver disorder (transplant, cirrhosis, or nonalcoholic steatohepatitis)  ? Hx of VTE  ? Hemorrhage requiring transfusion  ? GI perforation, leak, or obstruction  ? ====================================================  ?X  Male  ?  Age >/=60 years  ?  BMI >/=50 kg/m2  ?  CHF  ?  Dyspnea at Rest  ?  Paraplegia  ?X  Non-gastric-band surgery  ?  Operation Time >/=3 hr  ?  Return to OR   ?  Length of Stay >/= 3 d  ? Hypercoagulable condition  ? Significant venous stasis  ? ? ? ? ?Predicted probability of 30-day post-discharge VTE: 0.31% ? ?Other patient-specific factors to consider:no ? ?Recommendation for Discharge: ?No pharmacologic prophylaxis post-discharge ? ?Kurt Gomez is a 36 y.o. male who underwent laparoscopic sleeve gastrectomy 04/05/2021 ? ?No Known Allergies ? ?Patient Measurements: ?Height: 5\' 10"  (177.8 cm) ?Weight: (!) 144.7 kg (319 lb) ?IBW/kg (Calculated) : 73 ?Body mass index is 45.77 kg/m?. ? ?No results for input(s): WBC, HGB, HCT, PLT, APTT, CREATININE, LABCREA, CREATININE, CREAT24HRUR, MG, PHOS, ALBUMIN, PROT, ALBUMIN, AST, ALT, ALKPHOS, BILITOT, BILIDIR, IBILI in the last 72 hours. ?Estimated Creatinine Clearance: 127 mL/min (by C-G formula based on SCr of 1.19 mg/dL). ? ? ? ?Past Medical History:  ?Diagnosis Date  ? Anxiety and depression   ? At risk for sleep apnea   ? Elevated liver function tests   ? Family history of adverse reaction to anesthesia   ? mother PONV  ? GERD (gastroesophageal reflux disease)   ? HTN (hypertension)   ? Hyperlipidemia   ? Morbid obesity ([place])   ? Pre-diabetes   ? Sleep apnea   ? ? ? ?Medications Prior to Admission  ?Medication Sig Dispense Refill Last Dose  ? amLODipine (NORVASC) 10 MG tablet TAKE 1 TABLET BY MOUTH EVERY  DAY 30 tablet 0 04/04/2021  ? buPROPion (WELLBUTRIN XL) 300 MG 24 hr tablet TAKE 1 TABLET BY MOUTH EVERY DAY 90 tablet 0 04/04/2021  ? busPIRone (BUSPAR) 15 MG tablet TAKE 1 TABLET BY MOUTH TWICE A DAY 60 tablet 0 04/04/2021  ? carvedilol (COREG) 6.25 MG tablet Take 1 tablet (6.25 mg total) by mouth as needed (2 times a day, to take if needed if blood pressure remains elevated despite initiation of sprinolactone). (Patient taking differently: Take 6.25 mg by mouth 2 (two) times daily with a meal.) 60 tablet 3 04/04/2021  ? hydrochlorothiazide (HYDRODIURIL) 25 MG tablet Take 1 tablet (25 mg total) by mouth daily. 30 tablet 0 04/04/2021  ? losartan (COZAAR) 100 MG tablet Take 1 tablet (100 mg total) by mouth daily. 30 tablet 0 04/04/2021  ? omeprazole (PRILOSEC) 20 MG capsule Take 20 mg by mouth daily.   04/04/2021  ? spironolactone (ALDACTONE) 25 MG tablet TAKE 1 TABLET BY MOUTH EVERY DAY 30 tablet 0 04/04/2021  ? aspirin EC 81 MG tablet Take 81 mg by mouth daily. Swallow whole.     ? MOUNJARO 2.5 MG/0.5ML Pen INJECT 2.5 MG INTO THE SKIN ONCE A WEEK. (Patient not taking: Reported on 03/26/2021) 2 mL 1 Not Taking  ? Multiple Vitamins-Minerals (MULTIVITAMIN WITH MINERALS) tablet Take 1 tablet by mouth daily.     ? ? ? ?Eudelia Bunch, Pharm.D ?04/05/2021 1:39 PM ? ?

## 2021-04-05 NOTE — H&P (Signed)
Kurt Gomez [id number]    Referring Provider:  Self     Subjective    Chief Complaint: return weight loss       History of Present Illness: 36 year old man who is following up in anticipation of upcoming bariatric surgery.  I initially saw him over a year ago to begin the process.  His obesity is complicated by hypertension on multiple medications, hyperlipidemia managed by diet, anxiety and depression well managed with medication, GERD well managed with over-the-counter medications, elevated LFTs likely secondary to hepatic steatosis, prediabetes, and high risk for sleep apnea. He underwent chest x-ray and upper GI did show reflux but no hiatal hernia.  There was some concern regarding the status of mood and eating concerns raised by his psychology evaluation and there was some back-and-forth between his evaluator and his usual therapist.  The patient was able to work through this and noted some improvement on most recent evaluation and has been approved from a psychology standpoint also had a positive H. pylori screen which was treated.  Completed 6 months of supervised weight loss and has been cleared by the dietitian.   His disease has worsened in the interim.  His A1c has risen to 6, his weight has increased.  Otherwise denies any changes in his health and is ready to proceed.  Has several insightful questions to discuss today.     Initial visit 11/08/19:  "This is a very pleasant 36 year old who presents for consultation regarding surgical management of severe obesity. 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  He has tried numerous diets and exercise plans throughout the course of his young life with intermittent but temporary success.  He was briefly on phentermine  when he was younger, but had significant side effects including poor sleep and anger problems which made this an untenable treatment plan.  Now that he is out of grad school and beginning to get settled with his career, he feels that he is in a good position to pursue aggressive treatment of his obesity before it becomes life-threatening. Medical history includes hyperlipidemia, hypertension (on 3 medications), GERD, elevated liver function tests likely secondary to hepatic steatosis, prediabetes, anxiety and depression, and at risk for sleep apnea.  Medications include amlodipine, losartan-HCTZ, metoprolol, bupropion and buspirone.  He does endorse a history of reflux and takes over-the-counter omeprazole daily.  His symptoms are well controlled with this. No prior abdominal surgery. Family history notable for depression and his mother, hypertension, hyperlipidemia and diabetes in his father.  He endorses a significant family history of obesity including both parents and several aunts and uncles.  Much of his weight loss efforts and his young life were correlated with his mother's attempts at weight loss. Denies alcohol, drug use.  Former smoker, quit August 15 2019.   He reports that he does not do a large amount of physical activity at this point yet, and that he is still working on his eating patterns, noting that he does enjoy fast food from time to time.  He has a friend in [place] who is going through the bariatric process as well and the plan to be each other's accountability partners.   Recent physical with labs performed on 10/15/19 including a CMP which is notable  for creatinine of 1.28 and mild elevation and ALT to 56, but otherwise unremarkable; lipid profile demonstrates elevated total cholesterol, elevated LDL and elevated triglycerides; CBC which is unremarkable, hemoglobin A1c 5.7, TSH, T3 and T4 within normal limits, hepatitis A, B, and C panel which was all negative, urinalysis  unremarkable except for trace protein, and EKG which showed normal sinus rhythm.  315.5lb/BMI 46   OBESITY, MORBID, BMI 40.0-49.9 (E66.01) Story: He is a good candidate for sleeve gastrectomy. We discussed the surgery including technical aspects, the risks of bleeding, infection, pain, scarring, injury to intra-abdominal structures, staple line leak or abscess, chronic abdominal pain or nausea, worsened GERD, DVT/PE, pneumonia, heart attack, stroke, death, failure to reach weight loss goals and weight regain, hernia. Discussed the typical pre-, peri-, and postoperative course. Discussed the importance of lifelong behavioral changes to combat the chronic and relapsing disease which is obesity. Questions were welcomed and answered, will initiate him on the bariatric pathway. He reports that his insurance plan has a 6 month supervised weight loss mandate. HYPERTENSION (I10) Story: On 3 medications, per PCP HYPERLIPIDEMIA (E78.5) Story: not currently on medications, managed by primary care doctor ANXIETY AND DEPRESSION (F41.9) Story: Well controlled on current medications. He expressed interest in therapy sessions beyond the initial bariatric clearance- Will refer him to [facility] for initial bariatric evaluation and hope he can continue working with them GERD (GASTROESOPHAGEAL REFLUX DISEASE) (K21.9) Story: Well controlled with over-the-counter medications. He understands that this may worsen after sleeve gastrectomy. ELEVATED LFTS (R79.89) Story: Mild, likely secondary to hepatic steatosis PREDIABETES (R73.03) Story: Hemoglobin A1c 5.7. He does have a family history of diabetes. AT RISK FOR OBSTRUCTIVE SLEEP APNEA (Z91.89) Story: He scored fairly high on the questionnaire, we will order a sleep study"   Review of Systems: A complete review of systems was obtained from the patient.  I have reviewed this information and discussed as appropriate with the patient.  See HPI as well for other ROS.      Medical History:     Past Medical History:  Diagnosis Date   Anxiety     Hypertension     Sleep apnea        There is no problem list on file for this patient.          Past Surgical History:  Procedure Laterality Date   TONSILLECTOMY   1999      No Known Allergies         Current Outpatient Medications on File Prior to Visit  Medication Sig Dispense Refill   amLODIPine (NORVASC) 10 MG tablet Take 1 tablet by mouth once daily       carvediloL (COREG) 6.25 MG tablet Take by mouth       buPROPion (WELLBUTRIN XL) 300 MG XL tablet Take 300 mg by mouth once daily       busPIRone (BUSPAR) 15 MG tablet Take 15 mg by mouth 2 (two) times daily       hydroCHLOROthiazide (HYDRODIURIL) 25 MG tablet Take 25 mg by mouth once daily       losartan (COZAAR) 100 MG tablet Take 100 mg by mouth once daily       MOUNJARO 2.5 mg/0.5 mL PnIj INJECT 2.5 MG SUBCUTANEOUSLY WEEKLY       omeprazole (PRILOSEC) 20 MG DR capsule Take 20 mg by mouth once daily       spironolactone (ALDACTONE) 25 MG tablet Take 25 mg by mouth once daily  No current facility-administered medications on file prior to visit.           Family History  Problem Relation Age of Onset   Skin cancer Mother     High blood pressure (Hypertension) Mother     Obesity Mother     Diabetes Father     Hyperlipidemia (Elevated cholesterol) Father     Obesity Father     High blood pressure (Hypertension) Father        Social History        Tobacco Use  Smoking Status Former   Types: Cigarettes  Smokeless Tobacco Never      Social History         Socioeconomic History   Marital status: Single  Tobacco Use   Smoking status: Former      Types: Cigarettes   Smokeless tobacco: Never  Substance and Sexual Activity   Alcohol use: Never   Drug use: Never      Objective:         Vitals:    02/26/21 1530  BP: 120/84  Pulse: 105  Temp: 37.1 C (98.8 F)  SpO2: 97%  Weight: (!) 148.4 kg (327 lb 3.2 oz)   Height: 175.3 cm (5\' 9" )    Body mass index is 48.32 kg/m.   Alert and well-appearing Unlabored respirations   Assessment and Plan:  Diagnoses and all orders for this visit:   Morbid obesity (CMS-HCC)   Essential hypertension   Hyperlipidemia, unspecified hyperlipidemia type   Situational mixed anxiety and depressive disorder   Gastroesophageal reflux disease without esophagitis   Hepatic steatosis   Prediabetes   OSA on CPAP       He remains an excellent candidate for sleeve gastrectomy.  We have previously discussed the surgery including technical details, risks of bleeding, infection, pain, scarring, injury to intra-abdominal structures, staple line leak or abscess, chronic abdominal pain or nausea, worsening GERD, failure to maintain weight loss, weight regain, hernia, DVT/PEs, cardiovascular/pulmonary complications.  Questions welcomed and answered to his satisfaction.   Maygen Sirico Carlye Grippe, MD

## 2021-04-05 NOTE — Op Note (Signed)
Operative Note ? ?Kurt Gomez  ?[id number]  ?[id number]  ?04/05/2021 ? ? ?Surgeon: Phylliss Blakes MD ?  ?Assistant: Gaynelle Adu MD ?  ?Procedure performed: laparoscopic sleeve gastrectomy, hiatal hernia repair, upper endoscopy ?  ?Preop diagnosis: Morbid obesity Body mass index is 45.77 kg/m?Marland Kitchen ?Post-op diagnosis/intraop findings: same, small hiatal hernia ?  ?Specimens: fundus ?Retained items: none  ?EBL: minimal  ?Complications: none ?  ?Description of procedure: After obtaining informed consent and administration of chemical DVT prophylaxis in holding, the patient was taken to the operating room and placed supine on operating room table where general endotracheal anesthesia was initiated, preoperative antibiotics were administered, SCDs applied, and a formal timeout was performed. The abdomen was prepped and draped in usual sterile fashion. Peritoneal access was gained using a Visiport technique in the left upper quadrant and insufflation to 15 mmHg ensued without issue. Gross inspection revealed no evidence of injury. Under direct visualization three more 5 mm trochars were placed in the right and left hemiabdomen and the 63mm trocar in the right paramedian upper abdomen. Bilateral laparoscopic assisted TAPS blocks were performed with Exparel diluted with 0.25 percent Marcaine with epinephrine. The patient was placed in steep Trendelenburg and the liver retractor was introduced through an incision in the upper midline and secured to the post externally to maintain the left lobe retracted anteriorly.  ?The calibration tubing was passed down by the anesthesiologist and the balloon inflated to 10 mL, this was able to easily pass through the hiatus confirming the upper GI findings of small hiatal hernia. The pars lucida was entered with Harmonic scalpel and the posterior aspect of the right and left crus were dissected out using Harmonic and blunt dissection. The hiatus was narrowed with a single suture of 0 Ethibond  secured with the ty-knot device.  ?Using the Harmonic scalpel, the greater curvature of the stomach was dissected away from the greater omentum and short gastric vessels were divided. This began 6 cm from the pylorus, and dissection proceeded until the left crus was clearly exposed. There was a fair amount of adhesions of the posterior stomach to the pancreas and retroperitoneum which were divided with the Harmonic. Esophageal fat pad was mobilized off the anterior stomach slightly. The 33 [place] VisiGi was then introduced and directed down towards the pylorus. This was placed to suction against the lesser curve. Serial fires of the linear cutting stapler with seamguards were then employed to create our sleeve. The first fire used a green load and ensured adequate room at the angularis incisura. One gold load and then several blue loads were then employed to create a narrow tubular stomach up to the angle of His. The excised stomach was then removed through our 15 mm trocar site within an Endo Catch bag.  ?The visigi was taken off of suction and a few puffs of air were introduced, inflating the sleeve. No bubbles were observed in the irrigation fluid around the stomach and the shape was noted to be evenly tubular without any narrowing at the angularis. The visigi was then removed. Upper endoscopy was performed by the assistant surgeon and the sleeve was noted to be airtight, the staple line was hemostatic. Please see his separate note. The endoscope was removed. A small amount of oozing on the distal staple line was addressed with clips. The 15 mm trocar site fascia in the right upper abdomen was closed with a 0 Vicryl using the laparoscopic suture passer under direct visualization. The liver retractor was removed under direct  visualization. The abdomen was then desufflated and all remaining trochars removed. The skin incisions were closed with subcuticular Monocryl; benzoin, Steri-Strips and Band-Aids were applied  The patient was then awakened, extubated and taken to PACU in stable condition.   ?  ?All counts were correct at the completion of the case.  ?  ? ?

## 2021-04-05 NOTE — Transfer of Care (Signed)
Immediate Anesthesia Transfer of Care Note ? ?Patient: Kurt Gomez ? ?Procedure(s) Performed: LAPAROSCOPIC GASTRIC SLEEVE RESECTION WITH HIATAL HERNIA REPAIR (Abdomen) ?UPPER GI ENDOSCOPY (Esophagus) ? ?Patient Location: PACU ? ?Anesthesia Type:General ? ?Level of Consciousness: awake, alert , oriented and patient cooperative ? ?Airway & Oxygen Therapy: Patient Spontanous Breathing and Patient connected to face mask oxygen ? ?Post-op Assessment: Report given to RN, Post -op Vital signs reviewed and stable and Patient moving all extremities ? ?Post vital signs: Reviewed and stable ? ?Last Vitals:  ?Vitals Value Taken Time  ?BP 160/101 04/05/21 1135  ?Temp    ?Pulse 96 04/05/21 1137  ?Resp 17 04/05/21 1137  ?SpO2 100 % 04/05/21 1137  ?Vitals shown include unvalidated device data. ? ?Last Pain:  ?Vitals:  ? 04/05/21 0746  ?TempSrc:   ?PainSc: 0-No pain  ?   ? ?  ? ?Complications: No notable events documented. ?

## 2021-04-05 NOTE — Anesthesia Procedure Notes (Signed)
Procedure Name: Intubation ?Date/Time: 04/05/2021 9:50 AM ?Performed by: Florene Route, CRNA ?Pre-anesthesia Checklist: Patient identified, Emergency Drugs available, Suction available and Patient being monitored ?Patient Re-evaluated:Patient Re-evaluated prior to induction ?Oxygen Delivery Method: Circle system utilized ?Preoxygenation: Pre-oxygenation with 100% oxygen ?Induction Type: IV induction ?Ventilation: Mask ventilation without difficulty and Oral airway inserted - appropriate to patient size ?Laryngoscope Size: Hyacinth Meeker and 3 ?Grade View: Grade I ?Tube type: Oral ?Tube size: 8.0 mm ?Number of attempts: 1 ?Airway Equipment and Method: Stylet and Oral airway ?Placement Confirmation: ETT inserted through vocal cords under direct vision, positive ETCO2 and breath sounds checked- equal and bilateral ?Secured at: 23 cm ?Tube secured with: Tape ?Dental Injury: Teeth and Oropharynx as per pre-operative assessment  ? ? ? ? ?

## 2021-04-06 ENCOUNTER — Encounter (HOSPITAL_COMMUNITY): Payer: Self-pay | Admitting: Surgery

## 2021-04-06 LAB — COMPREHENSIVE METABOLIC PANEL
ALT: 48 U/L — ABNORMAL HIGH (ref 0–44)
AST: 29 U/L (ref 15–41)
Albumin: 3.8 g/dL (ref 3.5–5.0)
Alkaline Phosphatase: 63 U/L (ref 38–126)
Anion gap: 7 (ref 5–15)
BUN: 15 mg/dL (ref 6–20)
CO2: 28 mmol/L (ref 22–32)
Calcium: 9 mg/dL (ref 8.9–10.3)
Chloride: 101 mmol/L (ref 98–111)
Creatinine, Ser: 1.07 mg/dL (ref 0.61–1.24)
GFR, Estimated: >60 mL/min (ref >60)
Glucose, Bld: 109 mg/dL — ABNORMAL HIGH (ref 70–99)
Potassium: 4.3 mmol/L (ref 3.5–5.1)
Sodium: 136 mmol/L (ref 135–145)
Total Bilirubin: 0.3 mg/dL (ref 0.3–1.2)
Total Protein: 7.2 g/dL (ref 6.5–8.1)

## 2021-04-06 LAB — CBC WITH DIFFERENTIAL/PLATELET
Abs Immature Granulocytes: 0.03 10*3/uL (ref 0.00–0.07)
Basophils Absolute: 0 10*3/uL (ref 0.0–0.1)
Basophils Relative: 0 %
Eosinophils Absolute: 0 10*3/uL (ref 0.0–0.5)
Eosinophils Relative: 0 %
HCT: 39.8 % (ref 39.0–52.0)
Hemoglobin: 12.5 g/dL — ABNORMAL LOW (ref 13.0–17.0)
Immature Granulocytes: 0 %
Lymphocytes Relative: 21 %
Lymphs Abs: 2.1 10*3/uL (ref 0.7–4.0)
MCH: 26.9 pg (ref 26.0–34.0)
MCHC: 31.4 g/dL (ref 30.0–36.0)
MCV: 85.8 fL (ref 80.0–100.0)
Monocytes Absolute: 1 10*3/uL (ref 0.1–1.0)
Monocytes Relative: 10 %
Neutro Abs: 6.7 10*3/uL (ref 1.7–7.7)
Neutrophils Relative %: 69 %
Platelets: 322 10*3/uL (ref 150–400)
RBC: 4.64 MIL/uL (ref 4.22–5.81)
RDW: 15.2 % (ref 11.5–15.5)
WBC: 9.9 10*3/uL (ref 4.0–10.5)
nRBC: 0 % (ref 0.0–0.2)

## 2021-04-06 LAB — MAGNESIUM: Magnesium: 2.3 mg/dL (ref 1.7–2.4)

## 2021-04-06 LAB — SURGICAL PATHOLOGY

## 2021-04-06 MED ORDER — ONDANSETRON 4 MG PO TBDP: 4.0000 mg | ORAL_TABLET | Freq: Four times a day (QID) | ORAL | 0 refills | Status: DC | PRN

## 2021-04-06 MED ORDER — PANTOPRAZOLE SODIUM 40 MG PO TBEC: 40.0000 mg | DELAYED_RELEASE_TABLET | Freq: Every day | ORAL | 0 refills | Status: DC

## 2021-04-06 MED ORDER — DOCUSATE SODIUM 100 MG PO CAPS: 100.0000 mg | ORAL_CAPSULE | Freq: Two times a day (BID) | ORAL | 0 refills | Status: AC

## 2021-04-06 MED ORDER — GABAPENTIN 100 MG PO CAPS: 200.0000 mg | ORAL_CAPSULE | Freq: Two times a day (BID) | ORAL | 0 refills | Status: DC

## 2021-04-06 MED ORDER — TRAMADOL HCL 50 MG PO TABS: 50.0000 mg | ORAL_TABLET | Freq: Four times a day (QID) | ORAL | 0 refills | Status: DC | PRN

## 2021-04-06 MED ORDER — ACETAMINOPHEN 500 MG PO TABS: 1000.0000 mg | ORAL_TABLET | Freq: Three times a day (TID) | ORAL | 0 refills | Status: AC

## 2021-04-06 NOTE — Progress Notes (Signed)
Transition of Care (TOC) Screening Note ? ?Patient Details  ?Name: Kurt Gomez ?Date of Birth: [birth year] ? ?Transition of Care (TOC) CM/SW Contact:    ?Ewing Schlein, LCSW ?Phone Number: ?04/06/2021, 10:29 AM ? ?Transition of Care Department [facility]) has reviewed patient and no TOC needs have been identified at this time. We will continue to monitor patient advancement through interdisciplinary progression rounds. If new patient transition needs arise, please place a TOC consult. ?

## 2021-04-06 NOTE — Progress Notes (Signed)
24hr fluid recall prior to discharge: 540mL. Per dehydration protocol, will call pt to f/u within one week post op. 

## 2021-04-06 NOTE — Progress Notes (Signed)
Patient alert and oriented, pain is controlled. Patient is tolerating fluids, advanced to protein shake today, patient is tolerating well.  Reviewed Gastric sleeve discharge instructions with patient and patient is able to articulate understanding.  Provided information on BELT program, Support Group and [facility] outpatient pharmacy. All questions answered, will continue to monitor.  

## 2021-04-06 NOTE — Progress Notes (Signed)
S: Uneventful night.  Has some upper epigastric pain and occasional nausea, which is well controlled.  He is walking in the halls. ? ?O: ?Vitals, labs, intake/output, and orders reviewed at this time.  Afebrile, no tachycardia, mildly hypertensive, 98% on room air. 420 PO, 2000+ 1x UOP.  CMP/mag unremarkable, WBC 9.9, hemoglobin 12.5 (13.9 preop).  ? ? ?Gen: A&Ox3, no distress  ?H&N: EOMI, atraumatic, neck supple ?Chest: unlabored respirations, RRR ?Abd: soft, nontender, nondistended, incision(s) c/d/i with Steri-Strips and Band-Aids, no cellulitis or hematoma ?Ext: warm, no edema ?Neuro: grossly normal ? ?Lines/tubes/drains: PIV ? ?A/P: Postop day 1 status post sleeve gastrectomy with hiatal hernia repair ?-Continue liquids ?-Continue SCDs while in bed, prophylactic Lovenox, ambulate as tolerated, pulmonary toilet ?-Plan discharge later today ? ? ?Romana Juniper, MD FACS ?[facility] ? ?  ?

## 2021-04-06 NOTE — Progress Notes (Signed)
Discharge instructions discussed with patient, verbalzied agreement and understanding ?

## 2021-04-06 NOTE — Discharge Summary (Signed)
Physician Discharge Summary  ?Kurt Gomez [facility]:[id number] DOB: [birth year] DOA: 04/05/2021 ? ?PCP: Irene Pap, PA-C ? ?Admit date: 04/05/2021 ?Discharge date: 04/06/2021 ? ?Recommendations for Outpatient Follow-up:  ? ? Follow-up Information   ? ? Clovis Riley, MD. Daphane Shepherd on 04/30/2021.   ?Specialty: General Surgery ?Why: at 9:50am.  Please arrive 15 minutes prior to your appointment time.  Thank you. ?Contact information: ?[address] ?[address] ?[place] [address] ?[phone] ? ? ?  ?  ? ? Surgery, [place]. Go on 05/26/2021.   ?Specialty: General Surgery ?Why: at 9:20am with Dr. Kae Heller.  Please arrive 15 minutes prior to your appointment time.  Thank you. ?Contact information: ?[address] ?[address] ?[place] [address] ?[phone] ? ? ?  ?  ? ?  ?  ? ?  ? ?Discharge Diagnoses:  ?Principal Problem: ?  Morbid obesity ([place]) ? ? ?Surgical Procedure: Laparoscopic Sleeve Gastrectomy, upper endoscopy ? ?Discharge Condition: Good ?Disposition: Home ? ?Diet recommendation: Postoperative sleeve gastrectomy diet (liquids only) ? ?Filed Weights  ? 04/05/21 0746  ?Weight: (!) 144.7 kg  ? ? ? ?Hospital Course:  ?The patient was admitted for a planned laparoscopic sleeve gastrectomy. Please see operative note. Preoperatively the patient was given 5000 units of subcutaneous heparin for DVT prophylaxis. Postoperative prophylactic Lovenox dosing was started on the evening of postoperative day 0. ERAS protocol was used. On the evening of postoperative day 0, the patient was started on water and ice chips. On postoperative day 1 the patient had no fever or tachycardia and was tolerating water in their diet was gradually advanced throughout the day. The patient was ambulating without difficulty. Their vital signs are stable without fever or tachycardia. Their hemoglobin had remained stable. The patient had received discharge instructions and counseling. They were deemed stable for discharge and had met  discharge criteria ? ? ?Discharge Instructions ? ? ?Allergies as of 04/06/2021   ?No Known Allergies ?  ? ?  ?Medication List  ?  ? ?STOP taking these medications   ? ?aspirin EC 81 MG tablet ?  ?hydrochlorothiazide 25 MG tablet ?Commonly known as: HYDRODIURIL ?  ?Mounjaro 2.5 MG/0.5ML Pen ?Generic drug: tirzepatide ?  ?omeprazole 20 MG capsule ?Commonly known as: PRILOSEC ?  ?spironolactone 25 MG tablet ?Commonly known as: ALDACTONE ?  ? ?  ? ?TAKE these medications   ? ?acetaminophen 500 MG tablet ?Commonly known as: TYLENOL ?Take 2 tablets (1,000 mg total) by mouth every 8 (eight) hours for 5 days. ?  ?amLODipine 10 MG tablet ?Commonly known as: NORVASC ?TAKE 1 TABLET BY MOUTH EVERY DAY ?Notes to patient: Monitor Blood Pressure Daily and keep a log for primary care physician.  You may need to make changes to your medications with rapid weight loss.   ?  ?buPROPion 300 MG 24 hr tablet ?Commonly known as: WELLBUTRIN XL ?TAKE 1 TABLET BY MOUTH EVERY DAY ?  ?busPIRone 15 MG tablet ?Commonly known as: BUSPAR ?TAKE 1 TABLET BY MOUTH TWICE A DAY ?  ?carvedilol 6.25 MG tablet ?Commonly known as: COREG ?Take 1 tablet (6.25 mg total) by mouth as needed (2 times a day, to take if needed if blood pressure remains elevated despite initiation of sprinolactone). ?What changed: when to take this ?Notes to patient: Monitor Blood Pressure Daily and keep a log for primary care physician.  You may need to make changes to your medications with rapid weight loss.   ?  ?docusate sodium 100 MG capsule ?Commonly  known as: Colace ?Take 1 capsule (100 mg total) by mouth 2 (two) times daily. Okay to decrease to once daily or stop taking if having loose bowel movements ?  ?gabapentin 100 MG capsule ?Commonly known as: NEURONTIN ?Take 2 capsules (200 mg total) by mouth every 12 (twelve) hours. ?  ?losartan 100 MG tablet ?Commonly known as: COZAAR ?Take 1 tablet (100 mg total) by mouth daily. ?Notes to patient: Monitor Blood Pressure Daily and  keep a log for primary care physician.  You may need to make changes to your medications with rapid weight loss.   ?  ?multivitamin with minerals tablet ?Take 1 tablet by mouth daily. ?  ?ondansetron 4 MG disintegrating tablet ?Commonly known as: ZOFRAN-ODT ?Take 1 tablet (4 mg total) by mouth every 6 (six) hours as needed for nausea or vomiting. ?  ?pantoprazole 40 MG tablet ?Commonly known as: PROTONIX ?Take 1 tablet (40 mg total) by mouth daily. ?  ?traMADol 50 MG tablet ?Commonly known as: ULTRAM ?Take 1 tablet (50 mg total) by mouth every 6 (six) hours as needed (pain). ?  ? ?  ? ? Follow-up Information   ? ? Clovis Riley, MD. Daphane Shepherd on 04/30/2021.   ?Specialty: General Surgery ?Why: at 9:50am.  Please arrive 15 minutes prior to your appointment time.  Thank you. ?Contact information: ?[address] ?[address] ?[place] [address] ?[phone] ? ? ?  ?  ? ? Surgery, [place]. Go on 05/26/2021.   ?Specialty: General Surgery ?Why: at 9:20am with Dr. Kae Heller.  Please arrive 15 minutes prior to your appointment time.  Thank you. ?Contact information: ?[address] ?[address] ?[place] [address] ?[phone] ? ? ?  ?  ? ?  ?  ? ?  ? ? ? ?The results of significant diagnostics from this hospitalization (including imaging, microbiology, ancillary and laboratory) are listed below for reference.   ? ?Significant Diagnostic Studies: ?DG Chest 2 View ? ?Result Date: 04/02/2021 ?CLINICAL DATA:  Preop chest radiograph. EXAM: CHEST - 2 VIEW COMPARISON:  Chest radiograph dated 12/03/2019. FINDINGS: The heart size and mediastinal contours are within normal limits. Both lungs are clear. The visualized skeletal structures are unremarkable. IMPRESSION: No active cardiopulmonary disease. Electronically Signed   By: Anner Crete M.D.   On: 04/02/2021 01:09   ? ?Labs: ?Basic Metabolic Panel: ?Recent Labs  ?Lab 03/31/21 ?1450 04/06/21 ?0419  ?NA 137 136  ?K 3.7 4.3  ?CL 103 101  ?CO2 23 28  ?GLUCOSE 107* 109*   ?BUN 29* 15  ?CREATININE 1.19 1.07  ?CALCIUM 9.8 9.0  ?MG  --  2.3  ? ?Liver Function Tests: ?Recent Labs  ?Lab 03/31/21 ?1450 04/06/21 ?0419  ?AST 24 29  ?ALT 43 48*  ?ALKPHOS 70 63  ?BILITOT 0.2* 0.3  ?PROT 7.8 7.2  ?ALBUMIN 4.3 3.8  ? ? ?CBC: ?Recent Labs  ?Lab 03/31/21 ?1450 04/06/21 ?0419  ?WBC 9.1 9.9  ?NEUTROABS 5.0 6.7  ?HGB 13.9 12.5*  ?HCT 43.4 39.8  ?MCV 84.1 85.8  ?PLT 429* 322  ? ? ?CBG: ?Recent Labs  ?Lab 03/31/21 ?1349 04/05/21 ?0736 04/05/21 ?1256  ?GLUCAP 113* 96 107*  ? ? ?Principal Problem: ?  Morbid obesity ([place]) ? ? ? ?Signed: ? ?Clovis Riley MD FACS ?[facility] [place] ?[phone] ?04/07/2021, 8:21 AM ? ? ?

## 2021-04-06 NOTE — Progress Notes (Signed)
Patient alert and oriented, Post op day 1.  Provided support and encouragement.  Encouraged pulmonary toilet, ambulation and small sips of liquids.  All questions answered.  Will continue to monitor. 

## 2021-04-06 NOTE — Discharge Instructions (Signed)
GASTRIC BYPASS / SLEEVE  °Home Care Instructions ° °These instructions are to help you care for yourself when you go home. ° °Call: If you have any problems. °Call [phone] and ask for the surgeon on call °If you have an emergency related to your surgery please use the ER at [facility].  °Tell the ER staff that you are a new post-op gastric bypass or gastric sleeve patient °  °Signs and symptoms to report: Severe vomiting or nausea °If you cannot handle clear liquids for longer than 1 day, call your surgeon  °Abdominal pain which does not get better after taking your pain medication °Fever greater than 100.4° F and chills °Heart rate over 100 beats a minute °Trouble breathing °Chest pain ° Redness, swelling, drainage, or foul odor at incision (surgical) sites ° If your incisions open or pull apart °Swelling or pain in calf (lower leg) °Diarrhea (Loose bowel movements that happen often), frequent watery, uncontrolled bowel movements °Constipation, (no bowel movements for 3 days) if this happens:  °Take Milk of Magnesia, 2 tablespoons by mouth, 3 times a day for 2 days if needed °Stop taking Milk of Magnesia once you have had a bowel movement °Call your doctor if constipation continues °Or °Take Miralax  (instead of Milk of Magnesia) following the label instructions °Stop taking Miralax once you have had a bowel movement °Call your doctor if constipation continues °Anything you think is “abnormal for you” °  °Normal side effects after surgery: Unable to sleep at night or unable to concentrate °Irritability °Being tearful (crying) or depressed °These are common complaints, possibly related to your anesthesia, stress of surgery and change in lifestyle, that usually go away a few weeks after surgery.  If these feelings continue, call your medical doctor.  °Wound Care: You may have surgical glue, steri-strips, or staples over your incisions after surgery °Surgical glue:  Looks like a clear film over your incisions  and will wear off a little at a time °Steri-strips : Adhesive strips of tape over your incisions. You may notice a yellowish color on the skin under the steri-strips. This is used to make the   steri-strips stick better. Do not pull the steri-strips off - let them fall off °Staples: Staples may be removed before you leave the hospital °If you go home with staples, call [facility] at for an appointment with your surgeon’s nurse to have staples removed 10 days after surgery, [phone] °Showering: You may shower two (2) days after your surgery unless your surgeon tells you differently °Wash gently around incisions with warm soapy water, rinse well, and gently pat dry  °If you have a drain (tube from your incision), you may need someone to hold this while you shower  °No tub baths until staples are removed and incisions are healed   °  °Medications: Medications should be liquid or crushed if larger than the size of a dime °Extended release pills (medication that releases a little bit at a time through the day) should not be crushed °Depending on the size and number of medications you take, you may need to space (take a few throughout the day)/change the time you take your medications so that you do not over-fill your pouch (smaller stomach) °Make sure you follow-up with your primary care physician to make medication changes needed during rapid weight loss and life-style changes °If you have diabetes, follow up with the doctor that orders your diabetes medication(s) within one week after surgery and check   your blood sugar regularly. °Do not drive while taking narcotics (pain medications) °DO NOT take NSAID'S (Examples of NSAID's include ibuprofen, naproxen)  °Diet:                    First 2 Weeks ° You will see the nutritionist about two (2) weeks after your surgery. The nutritionist will increase the types of foods you can eat if you are handling liquids well: °If you have severe vomiting or nausea  and cannot handle clear liquids lasting longer than 1 day, call your surgeon  °Protein Shake °Drink at least 2 ounces of shake 5-6 times per day °Each serving of protein shakes (usually 8 - 12 ounces) should have a minimum of:  °15 grams of protein  °And no more than 5 grams of carbohydrate  °Goal for protein each day: °Men = 80 grams per day °Women = 60 grams per day °Protein powder may be added to fluids such as non-fat milk or Lactaid milk or Soy milk (limit to 35 grams added protein powder per serving) ° °Hydration °Slowly increase the amount of water and other clear liquids as tolerated (See Acceptable Fluids) °Slowly increase the amount of protein shake as tolerated  ° Sip fluids slowly and throughout the day °May use sugar substitutes in small amounts (no more than 6 - 8 packets per day; i.e. Splenda) ° °Fluid Goal °The first goal is to drink at least 8 ounces of protein shake/drink per day (or as directed by the nutritionist);  See handout from pre-op Bariatric Education Class for examples of protein shake/drink.   °Slowly increase the amount of protein shake you drink as tolerated °You may find it easier to slowly sip shakes throughout the day °It is important to get your proteins in first °Your fluid goal is to drink 64 - 100 ounces of fluid daily °It may take a few weeks to build up to this °32 oz (or more) should be clear liquids  °And  °32 oz (or more) should be full liquids (see below for examples) °Liquids should not contain sugar, caffeine, or carbonation ° °Clear Liquids: °Water or Sugar-free flavored water (i.e. Fruit H2O, Propel) °Decaffeinated coffee or tea (sugar-free) °Marcea Rojek Lite, Wyler’s Lite, Minute Maid Lite °Sugar-free Jell-O °Bouillon or broth °Sugar-free Popsicle:   *Less than 20 calories each; Limit 1 per day ° °Full Liquids: °Protein Shakes/Drinks + 2 choices per day of other full liquids °Full liquids must be: °No More Than 12 grams of Carbs per serving  °No More Than 3 grams of Fat  per serving °Strained low-fat cream soup °Non-Fat milk °Fat-free Lactaid Milk °Sugar-free yogurt (Dannon Lite & Fit, Greek yogurt) ° ° ° °  °Vitamins and Minerals Start 1 day after surgery unless otherwise directed by your surgeon °Bariatric Specific Complete Multivitamins °Chewable Calcium Citrate with Vitamin D-3 °(Example: 3 Chewable Calcium Plus 600 with Vitamin D-3) °Take 500 mg three (3) times a day for a total of 1500 mg each day °Do not take all 3 doses of calcium at one time as it may cause constipation, and you can only absorb 500 mg  at a time  °Do not mix multivitamins containing iron with calcium supplements; take 2 hours apart ° °Menstruating women and those at risk for anemia (a blood disease that causes weakness) may need extra iron °Talk with your doctor to see if you need more iron °If you need extra iron: Total daily Iron recommendation (including Vitamins) is 50 to 100   mg Iron/day °Do not stop taking or change any vitamins or minerals until you talk to your nutritionist or surgeon °Your nutritionist and/or surgeon must approve all vitamin and mineral supplements °  °Activity and Exercise: It is important to continue walking at home.  Limit your physical activity as instructed by your doctor.  During this time, use these guidelines: °Do not lift anything greater than ten (10) pounds for at least two (2) weeks °Do not go back to work or drive until your surgeon says you can °You may have sex when you feel comfortable  °It is VERY important for male patients to use a reliable birth control method; fertility often increases after surgery  °Do not get pregnant for at least 18 months °Start exercising as soon as your doctor tells you that you can °Make sure your doctor approves any physical activity °Start with a simple walking program °Walk 5-15 minutes each day, 7 days per week.  °Slowly increase until you are walking 30-45 minutes per day °Consider joining our BELT program. [phone] or email  [email or web address] °  °Special Instructions Things to remember: ° °Use your CPAP when sleeping if this applies to you, do not stop the use of CPAP unless directed by physician after a sleep study °[facility] has a free Bariatric Surgery Support Group that meets monthly, the 3rd Thursday, 6 pm.  Please review discharge information for date and location of this meeting. °It is very important to keep all follow up appointments with your surgeon, nutritionist, primary care physician, and behavioral health practitioner °After the first year, please follow up with your bariatric surgeon and nutritionist at least once a year in order to maintain best weight loss results ° ° °[facility]: [phone] °[facility]: [phone] °Bariatric Nurse Coordinator: [phone] °  °  °

## 2021-04-09 ENCOUNTER — Telehealth (HOSPITAL_COMMUNITY): Payer: Self-pay | Admitting: *Deleted

## 2021-04-09 NOTE — Telephone Encounter (Signed)
1.  Tell me about your pain and pain management? ?Pt states that he has been experiencing some intermittent abdominal cramping while drinking that lasts for a few seconds and then ?goes away?.  Pt states that ?you can just feel it going down?.  Pt can tolerate protein shakes and water.  Pt instructed to call [facility] if pain worsens. ? ?2.  Let's talk about fluid intake.  How much total fluid are you taking in? ?Pt states that he is getting in at least 64oz of fluid including protein shakes, bottled water, and broth.  Pt instructed to assess status and suggestions daily utilizing Hydration Action Plan on discharge folder and to call [facility] if in the "red zone".  ? ?3.  How much protein have you taken in the last 2 days? ?Pt states he is meeting his goal of 80g of protein each day with the protein shakes. ? ?4.  Have you had nausea?  Tell me about when have experienced nausea and what you did to help? ?Pt denies nausea. ?  ?5.  Has the frequency or color changed with your urine? ?Pt states that he is urinating "fine" with no changes in frequency or urgency.   ?  ?6.  Tell me what your incisions look like? ?"Incisions look fine". Pt denies a fever, chills.  Pt states incisions are not swollen, open, or draining.  Pt encouraged to call [facility] if incisions change. ?  ?7.  Have you been passing gas? BM? ?Pt states that he is having BMs. Last BM 04/09/21.   ?  ?8.  If a problem or question were to arise who would you call?  Do you know contact numbers for [facility], [facility], and [facility]? ?Pt denies dehydration symptoms.  Pt can describe s/sx of dehydration.  Pt knows to call [facility] for surgical, [facility] for nutrition, and [facility] for non-urgent questions or concerns. ?  ?9.  How has the walking going? ?Pt states he is walking around and able to be active without difficulty. ?  ?10. Are you still using your incentive spirometer?  If so, how often? ?Pt states that he is doing the I.S. q2h. Pt encouraged to use incentive spirometer, at least 10x every hour  while awake until he sees the surgeon. ? ?11.  How are your vitamins and calcium going?  How are you taking them? ?Pt states that he is taking his supplements and vitamins without difficulty. ? ? ?Reminded patient that the first 30 days post-operatively are important for successful recovery.  Practice good hand hygiene, wearing a mask when appropriate (since optional in most places), and minimizing exposure to people who live outside of the home, especially if they are exhibiting any respiratory, GI, or illness-like symptoms.   ? ?

## 2021-04-11 ENCOUNTER — Other Ambulatory Visit: Payer: Self-pay | Admitting: Physician Assistant

## 2021-04-12 ENCOUNTER — Encounter: Payer: Self-pay | Admitting: Physician Assistant

## 2021-04-12 ENCOUNTER — Other Ambulatory Visit: Payer: Self-pay | Admitting: Physician Assistant

## 2021-04-12 ENCOUNTER — Other Ambulatory Visit: Payer: Self-pay | Admitting: *Deleted

## 2021-04-12 DIAGNOSIS — I10 Essential (primary) hypertension: Secondary | ICD-10-CM

## 2021-04-12 MED ORDER — AMLODIPINE BESYLATE 10 MG PO TABS: 10.0000 mg | ORAL_TABLET | Freq: Every day | ORAL | 0 refills | Status: DC

## 2021-04-12 MED ORDER — LOSARTAN POTASSIUM 100 MG PO TABS: 100.0000 mg | ORAL_TABLET | Freq: Every day | ORAL | 0 refills | Status: DC

## 2021-04-12 NOTE — Telephone Encounter (Signed)
Medication refilled for 90 day. Last refill. Patient needs to schedule an office visit.

## 2021-04-12 NOTE — Telephone Encounter (Signed)
Pt advised advised and scheduled. ?

## 2021-04-20 ENCOUNTER — Ambulatory Visit: Payer: No Typology Code available for payment source | Admitting: Skilled Nursing Facility1

## 2021-04-29 ENCOUNTER — Other Ambulatory Visit: Payer: Self-pay

## 2021-04-29 DIAGNOSIS — I10 Essential (primary) hypertension: Secondary | ICD-10-CM

## 2021-04-29 MED ORDER — LOSARTAN POTASSIUM 100 MG PO TABS: 100.0000 mg | ORAL_TABLET | Freq: Every day | ORAL | 0 refills | Status: DC

## 2021-04-29 MED ORDER — AMLODIPINE BESYLATE 10 MG PO TABS: 10.0000 mg | ORAL_TABLET | Freq: Every day | ORAL | 0 refills | Status: DC

## 2021-04-30 ENCOUNTER — Encounter: Payer: No Typology Code available for payment source | Admitting: Physician Assistant

## 2021-05-01 IMAGING — DX DG CHEST 2V
2 series · 2 of 2 positions shown · non-contrast
Comparison: None.

CLINICAL DATA: Morbid obesity. Preop for gastric sleeve. Prior
smoker.

EXAM:
CHEST - 2 VIEW

[chest pa]
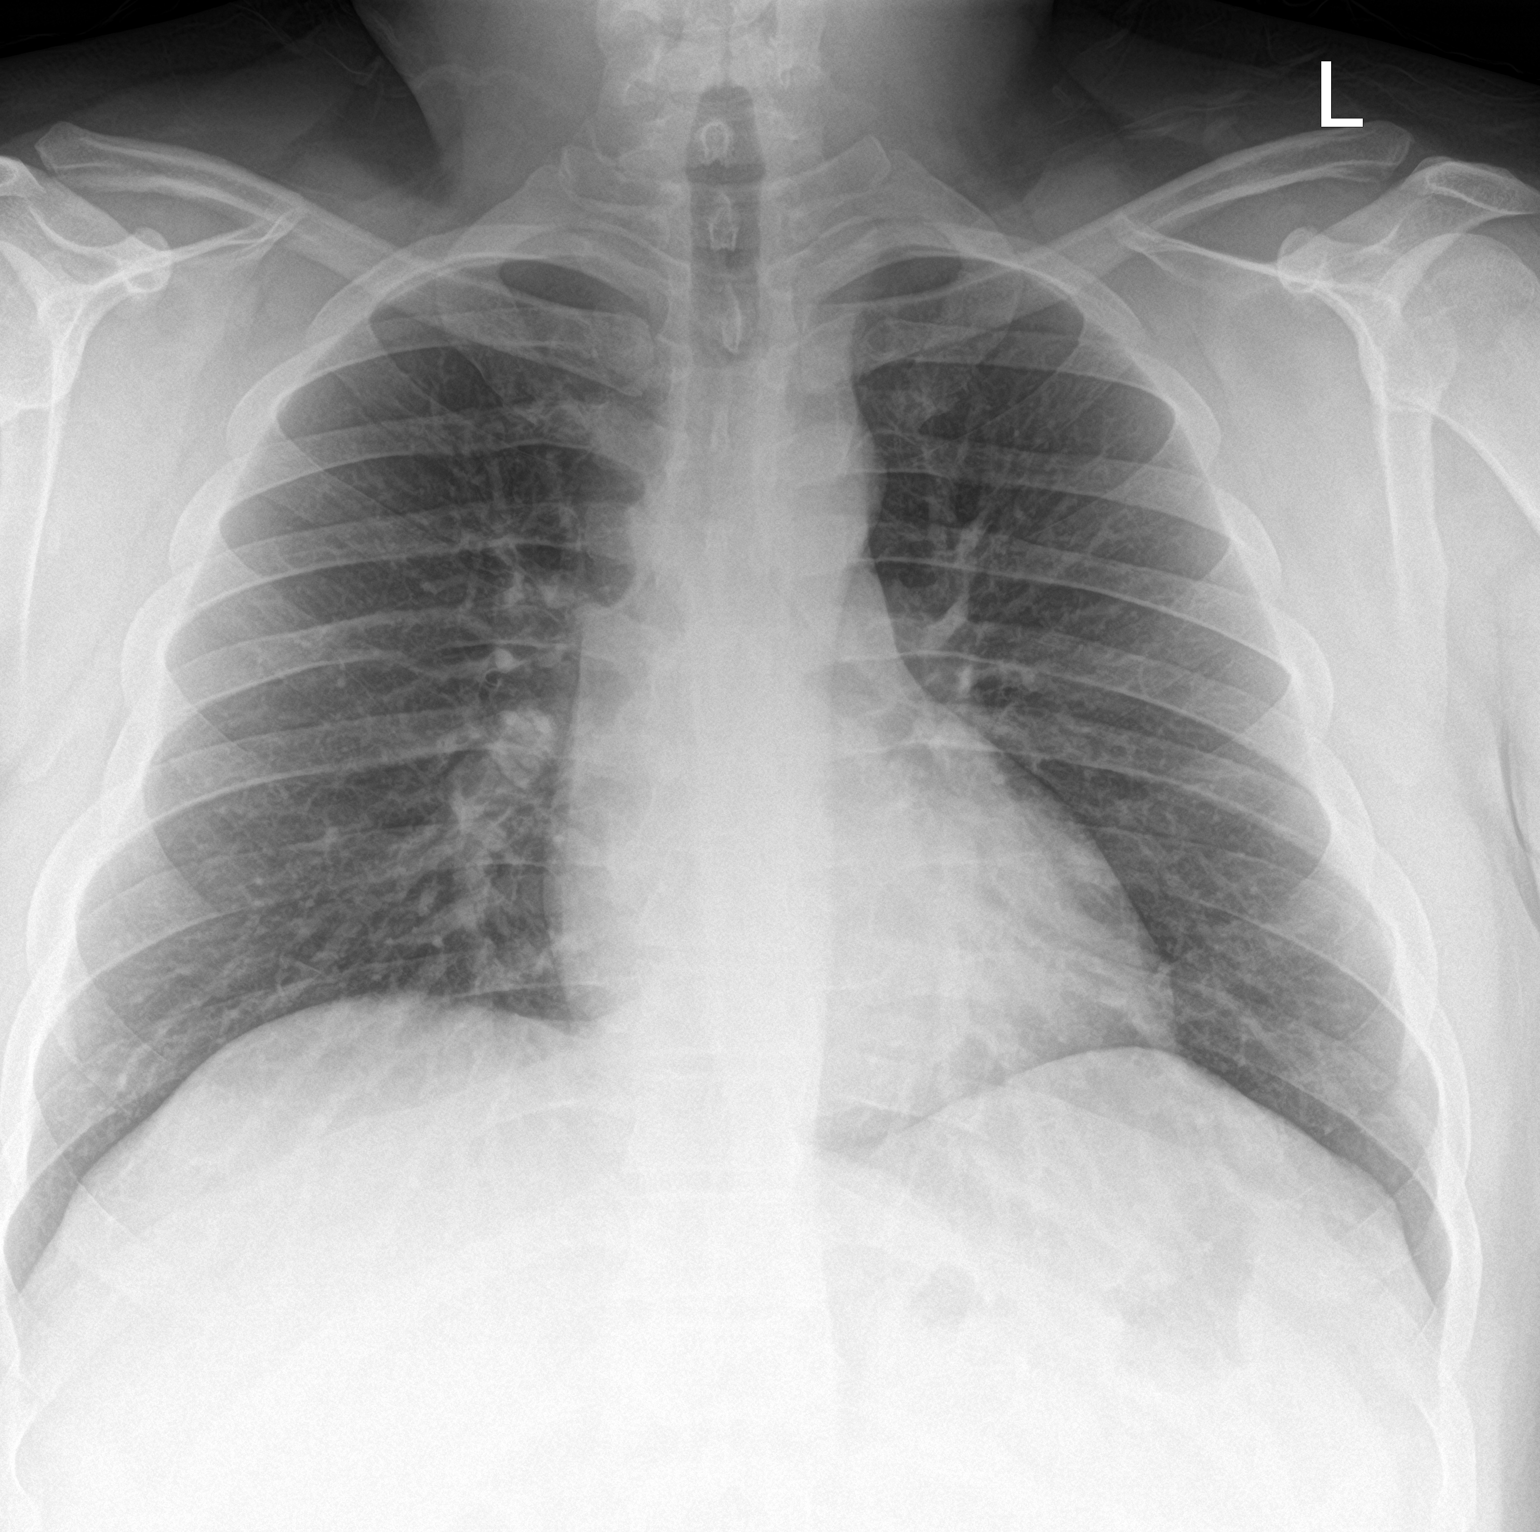

[chest lat]
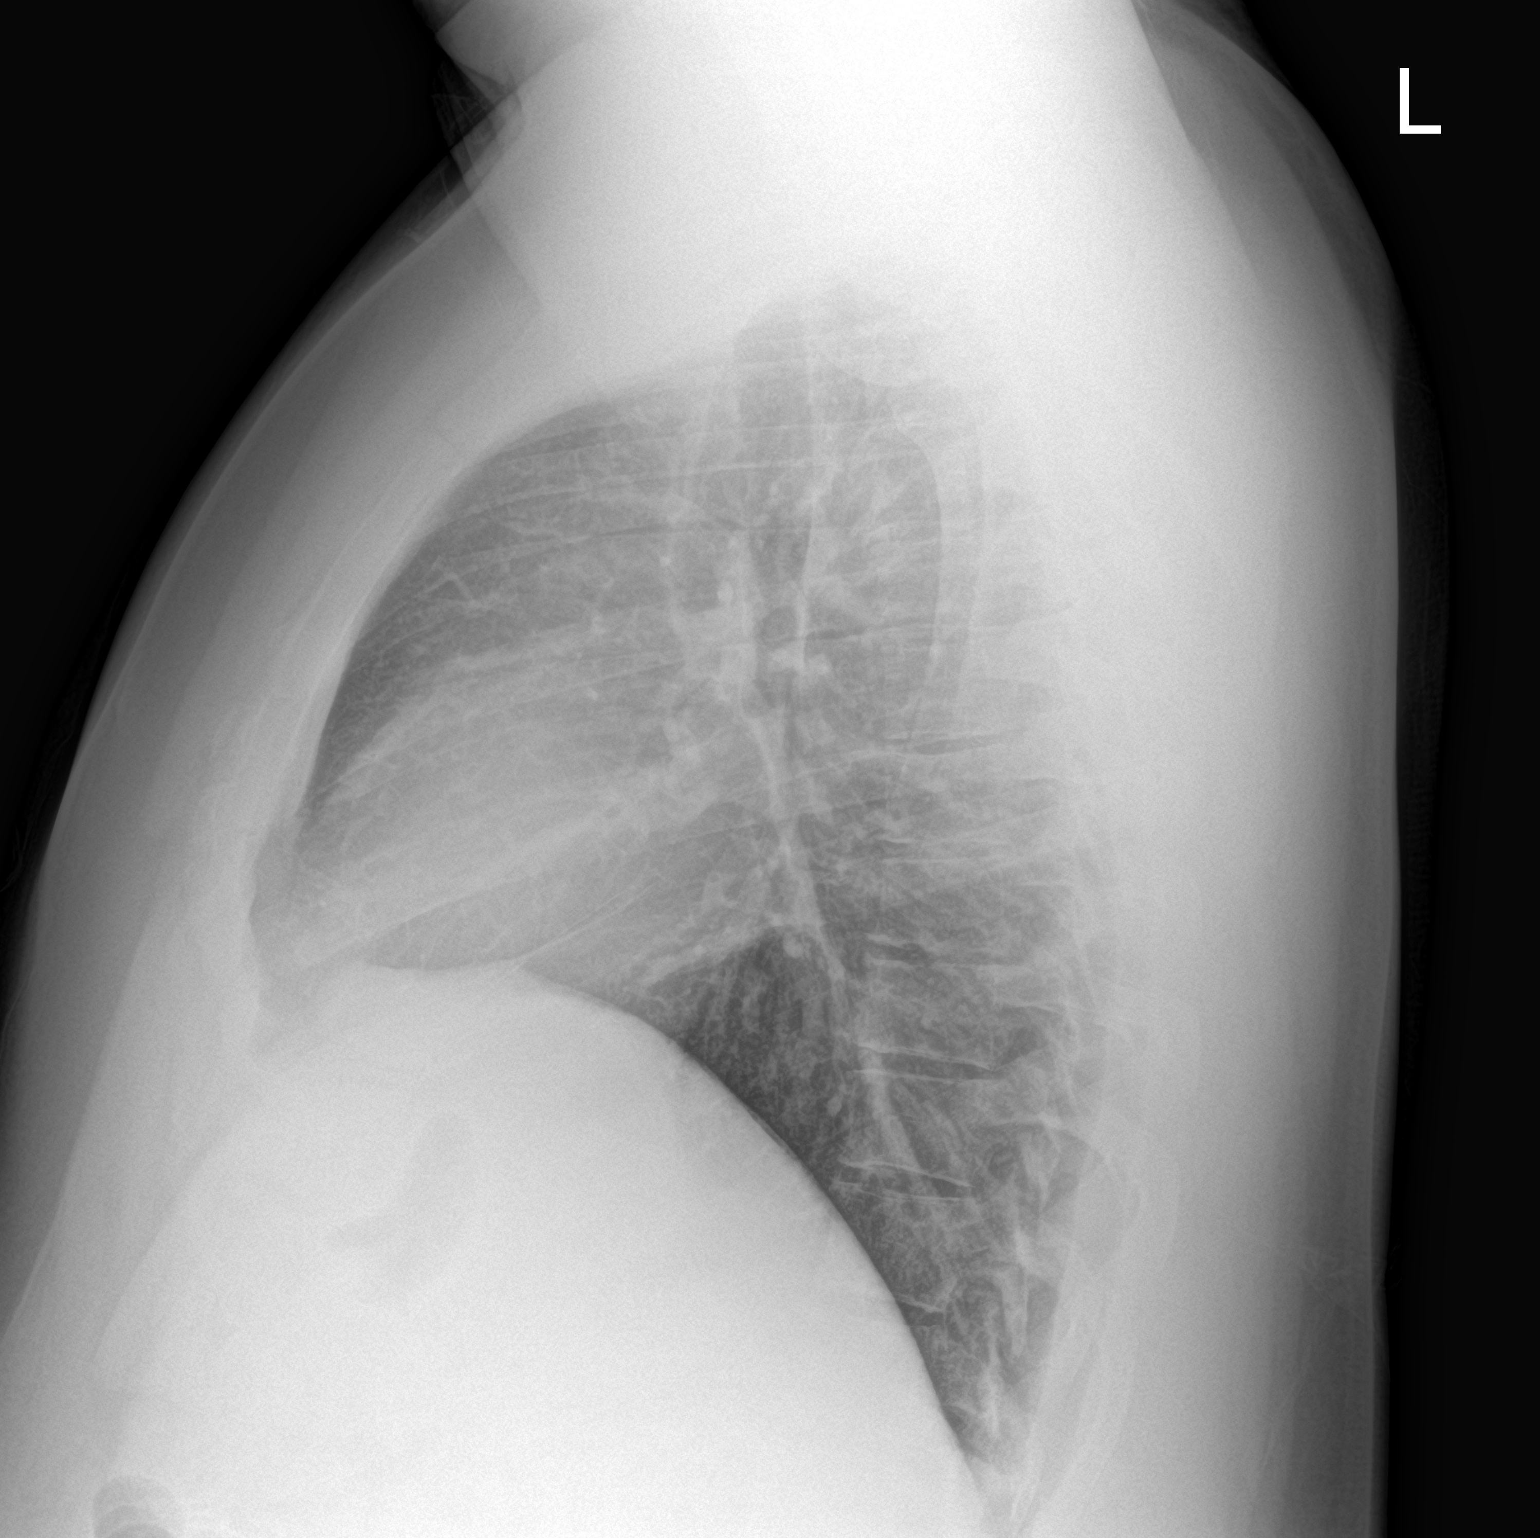

[2 of 2 positions shown; findings below may reference images not displayed]

FINDINGS: The cardiomediastinal contours are normal. The lungs are clear.
Pulmonary vasculature is normal. No consolidation, pleural effusion,
or pneumothorax. No acute osseous abnormalities are seen.
IMPRESSION: Negative radiographs of the chest.

## 2021-05-03 ENCOUNTER — Other Ambulatory Visit: Payer: Self-pay | Admitting: Physician Assistant

## 2021-05-13 NOTE — Progress Notes (Signed)
? ?Established Patient Office Visit ? ?Subjective:  ?Patient ID: Kurt Gomez, male    DOB: [birth year]  Age: 36 y.o. MRN: [id number] ? ?CC:  ?Chief Complaint  ?Patient presents with  ? Medication Refill  ?  Med check wants to see if he should continue with cardiologist. He states he only saw them once.   ? ? ?HPI ?Kurt Gomez presents for a chronic follow up appointment; reports on  ?04/05/2021 laparoscopic sleeve gastrectomy, hiatal hernia repair, upper endoscopy and is doing well after that procedure; asks if he can to discontinue some of his medicines after his procedure ? ?Outpatient Medications Prior to Visit  ?Medication Sig Dispense Refill  ? carvedilol (COREG) 6.25 MG tablet Take 1 tablet (6.25 mg total) by mouth as needed (2 times a day, to take if needed if blood pressure remains elevated despite initiation of sprinolactone). (Patient taking differently: Take 6.25 mg by mouth 2 (two) times daily with a meal.) 60 tablet 3  ? Multiple Vitamins-Minerals (MULTIVITAMIN WITH MINERALS) tablet Take 1 tablet by mouth daily.    ? amLODipine (NORVASC) 10 MG tablet Take 1 tablet (10 mg total) by mouth daily. 30 tablet 0  ? buPROPion (WELLBUTRIN XL) 300 MG 24 hr tablet TAKE 1 TABLET BY MOUTH EVERY DAY 90 tablet 0  ? busPIRone (BUSPAR) 15 MG tablet Take by mouth.    ? busPIRone (BUSPAR) 15 MG tablet TAKE 1 TABLET BY MOUTH TWICE A DAY 60 tablet 0  ? losartan (COZAAR) 100 MG tablet Take 1 tablet (100 mg total) by mouth daily. 30 tablet 0  ? pantoprazole (PROTONIX) 40 MG tablet Take 1 tablet (40 mg total) by mouth daily. 90 tablet 0  ? ondansetron (ZOFRAN-ODT) 4 MG disintegrating tablet Take 1 tablet (4 mg total) by mouth every 6 (six) hours as needed for nausea or vomiting. 20 tablet 0  ? gabapentin (NEURONTIN) 100 MG capsule Take 2 capsules (200 mg total) by mouth every 12 (twelve) hours. 20 capsule 0  ? hydrochlorothiazide (HYDRODIURIL) 25 MG tablet Take by mouth.    ? spironolactone (ALDACTONE) 25 MG tablet Take by mouth.     ? traMADol (ULTRAM) 50 MG tablet Take 1 tablet (50 mg total) by mouth every 6 (six) hours as needed (pain). 10 tablet 0  ? ?No facility-administered medications prior to visit.  ? ? ?No Known Allergies ? ?ROS ?Review of Systems  ?Constitutional:  Negative for activity change, chills, fatigue and fever.  ?HENT:  Negative for congestion, ear pain, hearing loss and voice change.   ?Eyes:  Negative for pain and redness.  ?Respiratory:  Negative for cough and shortness of breath.   ?Cardiovascular:  Negative for leg swelling.  ?Gastrointestinal:  Negative for constipation, diarrhea, nausea and vomiting.  ?Endocrine: Negative for polyuria.  ?Genitourinary:  Negative for flank pain and frequency.  ?Musculoskeletal:  Negative for joint swelling and neck pain.  ?Skin:  Negative for rash.  ?Neurological:  Negative for dizziness.  ?Hematological:  Does not bruise/bleed easily.  ?Psychiatric/Behavioral:  Negative for agitation and behavioral problems.   ? ?  ?Objective:  ?  ?Physical Exam ?Vitals reviewed.  ?Constitutional:   ?   General: He is not in acute distress. ?   Appearance: Normal appearance.  ?HENT:  ?   Head: Normocephalic and atraumatic.  ?   Right Ear: External ear normal.  ?   Left Ear: External ear normal.  ?   Nose: No congestion.  ?Eyes:  ?   Extraocular Movements: Extraocular  movements intact.  ?   Conjunctiva/sclera: Conjunctivae normal.  ?   Pupils: Pupils are equal, round, and reactive to light.  ?Cardiovascular:  ?   Rate and Rhythm: Normal rate and regular rhythm.  ?   Pulses: Normal pulses.  ?   Heart sounds: Normal heart sounds.  ?Pulmonary:  ?   Effort: Pulmonary effort is normal.  ?   Breath sounds: Normal breath sounds. No wheezing.  ?Abdominal:  ?   General: Bowel sounds are normal.  ?   Palpations: Abdomen is soft.  ?Musculoskeletal:     ?   General: Normal range of motion.  ?   Cervical back: Normal range of motion.  ?   Right lower leg: No edema.  ?   Left lower leg: No edema.  ?Skin: ?    General: Skin is warm and dry.  ?Neurological:  ?   Mental Status: He is alert and oriented to person, place, and time.  ?Psychiatric:     ?   Mood and Affect: Mood normal.     ?   Behavior: Behavior normal.  ? ? ?BP 130/90   Pulse 86   Wt 291 lb 12.8 oz (132.4 kg)   SpO2 99%   BMI 41.87 kg/m?  ? ?Lost 32 pounds since 03/31/2021 ? ?BP Readings from Last 10 Encounters:  ?05/14/21 130/90  ?04/06/21 (!) 153/81  ?03/31/21 (!) 153/97  ?09/09/20 130/80  ?12/10/19 (!) 144/86  ?11/14/19 138/90  ?10/15/19 (!) 130/98  ? ? ? ?Wt Readings from Last 3 Encounters:  ?05/14/21 291 lb 12.8 oz (132.4 kg)  ?04/05/21 (!) 319 lb (144.7 kg)  ?03/31/21 (!) 323 lb (146.5 kg)  ? ? ?Results for orders placed or performed during the hospital encounter of 04/05/21  ?Glucose, capillary  ?Result Value Ref Range  ? Glucose-Capillary 96 70 - 99 mg/dL  ?Glucose, capillary  ?Result Value Ref Range  ? Glucose-Capillary 107 (H) 70 - 99 mg/dL  ?CBC WITH DIFFERENTIAL  ?Result Value Ref Range  ? WBC 9.9 4.0 - 10.5 K/uL  ? RBC 4.64 4.22 - 5.81 MIL/uL  ? Hemoglobin 12.5 (L) 13.0 - 17.0 g/dL  ? HCT 39.8 39.0 - 52.0 %  ? MCV 85.8 80.0 - 100.0 fL  ? MCH 26.9 26.0 - 34.0 pg  ? MCHC 31.4 30.0 - 36.0 g/dL  ? RDW 15.2 11.5 - 15.5 %  ? Platelets 322 150 - 400 K/uL  ? nRBC 0.0 0.0 - 0.2 %  ? Neutrophils Relative % 69 %  ? Neutro Abs 6.7 1.7 - 7.7 K/uL  ? Lymphocytes Relative 21 %  ? Lymphs Abs 2.1 0.7 - 4.0 K/uL  ? Monocytes Relative 10 %  ? Monocytes Absolute 1.0 0.1 - 1.0 K/uL  ? Eosinophils Relative 0 %  ? Eosinophils Absolute 0.0 0.0 - 0.5 K/uL  ? Basophils Relative 0 %  ? Basophils Absolute 0.0 0.0 - 0.1 K/uL  ? Immature Granulocytes 0 %  ? Abs Immature Granulocytes 0.03 0.00 - 0.07 K/uL  ?Comprehensive metabolic panel  ?Result Value Ref Range  ? Sodium 136 135 - 145 mmol/L  ? Potassium 4.3 3.5 - 5.1 mmol/L  ? Chloride 101 98 - 111 mmol/L  ? CO2 28 22 - 32 mmol/L  ? Glucose, Bld 109 (H) 70 - 99 mg/dL  ? BUN 15 6 - 20 mg/dL  ? Creatinine, Ser 1.07 0.61 - 1.24  mg/dL  ? Calcium 9.0 8.9 - 10.3 mg/dL  ? Total Protein 7.2 6.5 -  8.1 g/dL  ? Albumin 3.8 3.5 - 5.0 g/dL  ? AST 29 15 - 41 U/L  ? ALT 48 (H) 0 - 44 U/L  ? Alkaline Phosphatase 63 38 - 126 U/L  ? Total Bilirubin 0.3 0.3 - 1.2 mg/dL  ? GFR, Estimated >60 >60 mL/min  ? Anion gap 7 5 - 15  ?Magnesium  ?Result Value Ref Range  ? Magnesium 2.3 1.7 - 2.4 mg/dL  ?ABO/Rh  ?Result Value Ref Range  ? ABO/RH(D)    ?  A POS ?Performed at [facility], [place] [address], [place], [place] [address] ?  ?Surgical pathology  ?Result Value Ref Range  ? SURGICAL PATHOLOGY    ?  SURGICAL PATHOLOGY ?CASE: [id number] ?PATIENT: [place] ?Surgical Pathology Report ? ? ? ? ?Clinical History: Morbid obesity (crm) ? ? ? ? ?FINAL MICROSCOPIC DIAGNOSIS: ? ?A. STOMACH, SLEEVE RESECTION: ?-Gross diagnosis only: Portion of unremarkable stomach ? ?GROSS DESCRIPTION: ? ?The specimen is received fresh and consists of a disrupted portion of ?tan-pink soft tissue, measuring 27.6 x 4.2 x 2.3 cm the serosal surface ?is focally disrupted, focally dusky, with a small amount of attached ?tan-yellow adipose tissue.  A 3.5 cm defect is identified at one end of ?the specimen, and a 2.3 cm defect is noted at the midportion.  The lumen ?contains a small amount of red-brown, hemorrhagic gelatinous material. ?The specimen ranges from tan-pink to pink-purple and dusky, and is ?moderately disrupted.  No mucosal lesions are grossly identified.  Wall ?averages 0.2 cm in thickness.  No sections are submitted, gross exam ?only.  Craig Staggers 04/05/2021) ? ? ? ?Final Diagnosis performed by Elicia Lamp esimer, MD.   Electronically ?signed 04/06/2021 ?Technical component performed at [facility], [place] ?[address] [place], [place] [address]. ? Professional component performed at [employer] [facility]. [place] [address], [place], [place] [address]. ? Immunohistochemistry Technical component (if applicable) was performed ?at [facility]. [place], [address], ?[place],  [address].   IMMUNOHISTOCHEMISTRY DISCLAIMER (if applicable): ?Some of these immunohistochemical stains may have been developed and the ?performance c

## 2021-05-14 ENCOUNTER — Ambulatory Visit (INDEPENDENT_AMBULATORY_CARE_PROVIDER_SITE_OTHER): Payer: No Typology Code available for payment source | Admitting: Physician Assistant

## 2021-05-14 ENCOUNTER — Encounter: Payer: Self-pay | Admitting: Physician Assistant

## 2021-05-14 VITALS — BP 130/90 | HR 86 | Ht 70.0 in | Wt 291.8 lb

## 2021-05-14 DIAGNOSIS — R7303 Prediabetes: Secondary | ICD-10-CM | POA: Diagnosis not present

## 2021-05-14 DIAGNOSIS — Z9989 Dependence on other enabling machines and devices: Secondary | ICD-10-CM

## 2021-05-14 DIAGNOSIS — I10 Essential (primary) hypertension: Secondary | ICD-10-CM

## 2021-05-14 DIAGNOSIS — E661 Drug-induced obesity: Secondary | ICD-10-CM

## 2021-05-14 DIAGNOSIS — Z6841 Body Mass Index (BMI) 40.0 and over, adult: Secondary | ICD-10-CM

## 2021-05-14 MED ORDER — BUPROPION HCL ER (XL) 300 MG PO TB24: 300.0000 mg | ORAL_TABLET | Freq: Every day | ORAL | 2 refills | Status: DC

## 2021-05-14 MED ORDER — BUSPIRONE HCL 15 MG PO TABS: 15.0000 mg | ORAL_TABLET | Freq: Two times a day (BID) | ORAL | 2 refills | Status: DC

## 2021-05-14 MED ORDER — PANTOPRAZOLE SODIUM 40 MG PO TBEC: 40.0000 mg | DELAYED_RELEASE_TABLET | Freq: Every day | ORAL | 2 refills | Status: DC

## 2021-05-14 MED ORDER — AMLODIPINE BESYLATE 10 MG PO TABS: 10.0000 mg | ORAL_TABLET | Freq: Every day | ORAL | 2 refills | Status: DC

## 2021-05-14 MED ORDER — LOSARTAN POTASSIUM 100 MG PO TABS: 100.0000 mg | ORAL_TABLET | Freq: Every day | ORAL | 2 refills | Status: DC

## 2021-05-14 NOTE — Patient Instructions (Signed)
Wt Readings from Last 3 Encounters:  ?05/14/21 291 lb 12.8 oz (132.4 kg)  ?04/05/21 (!) 319 lb (144.7 kg)  ?03/31/21 (!) 323 lb (146.5 kg)  ? ?BP Readings from Last 5 Encounters:  ?05/14/21 130/90  ?04/06/21 (!) 153/81  ?03/31/21 (!) 153/97  ?09/09/20 130/80  ?12/10/19 (!) 144/86  ? ? ?

## 2021-05-17 ENCOUNTER — Encounter: Payer: Self-pay | Admitting: Physician Assistant

## 2021-05-17 DIAGNOSIS — E661 Drug-induced obesity: Secondary | ICD-10-CM | POA: Insufficient documentation

## 2021-05-17 NOTE — Assessment & Plan Note (Signed)
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

## 2021-05-17 NOTE — Assessment & Plan Note (Signed)
controlled, eat a low sugar diet, avoid starchy food with a lot of carbohydrates, avoid fried and processed foods; last hgb a1c 5.2 on 03/31/2021 ? ?

## 2021-05-17 NOTE — Assessment & Plan Note (Signed)
controlled, continue amlodipine 10 mg qd and losartan 100 mg qd, eat a low salt diet, do not add any salt to food when cooking, avoid processed foods, avoid fried foods ? ?

## 2021-06-04 ENCOUNTER — Other Ambulatory Visit: Payer: Self-pay | Admitting: Physician Assistant

## 2021-06-29 ENCOUNTER — Other Ambulatory Visit: Payer: Self-pay

## 2021-06-29 MED ORDER — CARVEDILOL 6.25 MG PO TABS: 6.2500 mg | ORAL_TABLET | ORAL | 0 refills | Status: DC | PRN

## 2021-07-14 ENCOUNTER — Other Ambulatory Visit: Payer: Self-pay

## 2021-07-16 ENCOUNTER — Other Ambulatory Visit: Payer: Self-pay

## 2021-07-29 ENCOUNTER — Other Ambulatory Visit: Payer: Self-pay

## 2021-07-29 ENCOUNTER — Telehealth: Payer: Self-pay | Admitting: Cardiology

## 2021-07-29 MED ORDER — CARVEDILOL 6.25 MG PO TABS: 6.2500 mg | ORAL_TABLET | ORAL | 0 refills | Status: DC | PRN

## 2021-07-29 NOTE — Telephone Encounter (Signed)
*  STAT* If patient is at the pharmacy, call can be transferred to refill team.   1. Which medications need to be refilled? (please list name of each medication and dose if known)   carvedilol (COREG) 6.25 MG tablet    2. Which pharmacy/location (including street and city if local pharmacy) is medication to be sent to? [id number] - [place], [place] - [address]  3. Do they need a 30 day or 90 day supply? 90  Pt scheduled a f/u w/ Dr. Shari Prows 08/24/21 at 8:40am.

## 2021-07-30 ENCOUNTER — Encounter: Payer: Self-pay | Admitting: Internal Medicine

## 2021-08-20 NOTE — Progress Notes (Unsigned)
Cardiology Office Note:    Date:  08/24/2021   ID:  Kurt Gomez, DOB 1985-06-26, MRN [id number]  PCP:  Lexine Baton  [facility] Cardiologist:  None  [facility] Electrophysiologist:  None   Referring MD: Jake Shark, PA-C    History of Present Illness:    Kurt Gomez is a 36 y.o. male with a hx of CKD, HTN, morbid obesity, and HLD who presents to clinic for follow-up.  Patient was initially diagnosed with HTN in 2017. Has been poorly controlled despite use of 4 antihypertensive agents. Was previously followed in [place] where he underwent renal ultrasound which was reportedly normal. Not sure if he had other work-up of his hypertension but believes it was considered genetic as everyone in his family has elevated BP.  At his last visit in 12/2019, we adjusted his antihypertensives and he was maintained amlodipine, coreg, spironolactone, losartan-HCTZ.  Today, the patient states he underwent gastric sleeve placement on 03/2021 and lost 60lbs. He feels overall well. He is exercising regularly going to the gym 3x/week and doing yoga on the other days without any exertional symptoms. Blood pressure is well controlled currently on amlodipine 10mg , losartan 100mg , and coreg 6.25mg  BID.  Family history: mother-HTN, father-HTN, aunts and uncles-HTN (all diagnosed early). Uncles with CAD and MI.   Past Medical History:  Diagnosis Date   Anxiety and depression    At risk for sleep apnea    Elevated liver function tests    Family history of adverse reaction to anesthesia    mother PONV   GERD (gastroesophageal reflux disease)    HTN (hypertension)    Hyperlipidemia    Morbid obesity (HCC)    Pre-diabetes    Sleep apnea    Uncontrolled hypertension 11/14/2019    Past Surgical History:  Procedure Laterality Date   LAPAROSCOPIC GASTRIC SLEEVE RESECTION N/A 04/05/2021   Procedure: LAPAROSCOPIC GASTRIC SLEEVE RESECTION WITH HIATAL HERNIA REPAIR;  Surgeon: 11/16/2019, MD;  Location: [facility];  Service: General;  Laterality: N/A;   TONSILLECTOMY AND ADENOIDECTOMY     UPPER GI ENDOSCOPY N/A 04/05/2021   Procedure: UPPER GI ENDOSCOPY;  Surgeon: Berna Bue, MD;  Location: [facility];  Service: General;  Laterality: N/A;   WISDOM TOOTH EXTRACTION      Current Medications: Current Meds  Medication Sig   amLODipine (NORVASC) 10 MG tablet Take 1 tablet (10 mg total) by mouth daily.   buPROPion (WELLBUTRIN XL) 300 MG 24 hr tablet Take 1 tablet (300 mg total) by mouth daily.   busPIRone (BUSPAR) 15 MG tablet Take 1 tablet (15 mg total) by mouth 2 (two) times daily.   carvedilol (COREG) 6.25 MG tablet Take 1 tablet (6.25 mg total) by mouth as needed (2 times a day, to take if needed if blood pressure remains elevated despite initiation of sprinolactone). Please keep upcoming appt with Dr. 06/05/2021.   losartan (COZAAR) 100 MG tablet TAKE 1 TABLET BY MOUTH EVERY DAY   Multiple Vitamins-Minerals (MULTIVITAMIN WITH MINERALS) tablet Take 1 tablet by mouth daily.   pantoprazole (PROTONIX) 40 MG tablet Take 1 tablet (40 mg total) by mouth daily.     Allergies:   Patient has no known allergies.   Social History   Socioeconomic History   Marital status: Single    Spouse name: Not on file   Number of children: Not on file   Years of education: Not on file   Highest education level: Not on file  Occupational History   Not on file  Tobacco Use   Smoking status: Former    Packs/day: 0.50    Years: 10.00    Total pack years: 5.00    Types: Cigarettes    Quit date: 08/15/2019    Years since quitting: 2.0   Smokeless tobacco: Never  Vaping Use   Vaping Use: Never used  Substance and Sexual Activity   Alcohol use: Never   Drug use: Never   Sexual activity: Not Currently  Other Topics Concern   Not on file  Social History Narrative   Not on file   Social Determinants of Health   Financial Resource Strain: Not on file  Food Insecurity: No Food  Insecurity (12/05/2019)   Hunger Vital Sign    Worried About Running Out of Food in the Last Year: Never true    Ran Out of Food in the Last Year: Never true  Transportation Needs: Not on file  Physical Activity: Not on file  Stress: Not on file  Social Connections: Not on file     Family History: The patient's family history includes Depression in his mother; Diabetes in his father; Hyperlipidemia in his father; Hypertension in his father.  ROS:   Please see the history of present illness.    Review of Systems  Constitutional:  Negative for chills and fever.  HENT:  Negative for hearing loss.   Eyes:  Negative for blurred vision.  Respiratory:  Negative for cough and shortness of breath.   Cardiovascular:  Negative for chest pain, palpitations, orthopnea, claudication, leg swelling and PND.  Gastrointestinal:  Negative for blood in stool, nausea and vomiting.  Genitourinary:  Negative for hematuria.  Musculoskeletal:  Negative for myalgias.  Neurological:  Negative for dizziness and loss of consciousness.  Psychiatric/Behavioral:  Negative for substance abuse.     EKGs/Labs/Other Studies Reviewed:    The following studies were reviewed today: No CV studies  EKG:  No new ECG today  Recent Labs: 04/06/2021: ALT 48; BUN 15; Creatinine, Ser 1.07; Hemoglobin 12.5; Magnesium 2.3; Platelets 322; Potassium 4.3; Sodium 136  Recent Lipid Panel    Component Value Date/Time   CHOL 229 (H) 10/15/2019 1555   TRIG 285 (H) 10/15/2019 1555   HDL 40 10/15/2019 1555   CHOLHDL 5.7 (H) 10/15/2019 1555   LDLCALC 138 (H) 10/15/2019 1555     Physical Exam:    VS:  BP 108/62   Pulse 80   Ht 5\' 10"  (1.778 m)   Wt 268 lb 12.8 oz (121.9 kg)   SpO2 98%   BMI 38.57 kg/m     Wt Readings from Last 3 Encounters:  08/24/21 268 lb 12.8 oz (121.9 kg)  05/14/21 291 lb 12.8 oz (132.4 kg)  04/05/21 (!) 319 lb (144.7 kg)     GEN:  Well nourished, well developed in no acute distress HEENT:  Normal NECK: No JVD; No carotid bruits CARDIAC: RRR, no murmurs, rubs, gallops RESPIRATORY:  Clear to auscultation without rales, wheezing or rhonchi  ABDOMEN: Obese, soft, non-tender, non-distended MUSCULOSKELETAL:  No edema; No deformity  SKIN: Warm and dry NEUROLOGIC:  Alert and oriented x 3 PSYCHIATRIC:  Normal affect   ASSESSMENT:    1. Primary hypertension   2. Medication management   3. Mixed hyperlipidemia   4. Hyperlipidemia, unspecified hyperlipidemia type   5. Obstructive sleep apnea   6. Class 2 obesity due to excess calories with body mass index (BMI) of 38.0 to 38.9 in adult, unspecified  whether serious comorbidity present   7. Elevated LFTs    PLAN:    In order of problems listed above:  #HTN: Diagnosed in 2017 in [place]. Previously difficult to control, however, it is much better controlled after gastric sleeve with >60lbs weight loss. He has been weaned off of spironolactone and HCTZ. BP currently 110-120s.  -Continue amlodipine 10mg  daily -Continue losartan 100mg  daily -Continue coreg 6.25mg  BID  #OSA: -Continue CPAP  #Obesity with BMI 46>38: S/p gastric sleeve placement with 60lbs weight loss. Congratulated him on this incredible achievement. He is very active and exercises regularly. Will continue with lifestyle modifications.   #HLD: LDL 138, HDL 40, TG 285 in 2021. ASCVD risk low due to age. Will repeat now with significant weight loss.  -Diet and lifestyle modifications -Continue weight loss efforts -Management per PCP  #Elevated LFTs: Likely due to fatty liver. Manage weight loss as above. -Repeat LFTs for monitoring  Exercise recommendations: Goal of exercising for at least 30 minutes a day, at least 5 times per week.  Please exercise to a moderate exertion.  This means that while exercising it is difficult to speak in full sentences, however you are not so short of breath that you feel you must stop, and not so comfortable that you can carry on  a full conversation.  Exertion level should be approximately a 5/10, if 10 is the most exertion you can perform.  Diet recommendations: Recommend a heart healthy diet such as the Mediterranean diet.  This diet consists of plant based foods, healthy fats, lean meats, olive oil.  It suggests limiting the intake of simple carbohydrates such as white breads, pastries, and pastas.  It also limits the amount of red meat, wine, and dairy products such as cheese that one should consume on a daily basis.   Shared Decision Making/Informed Consent      Medication Adjustments/Labs and Tests Ordered: Current medicines are reviewed at length with the patient today.  Concerns regarding medicines are outlined above.  Orders Placed This Encounter  Procedures   Lipid Profile   Hepatic function panel   No orders of the defined types were placed in this encounter.   Patient Instructions  Medication Instructions:   Your physician recommends that you continue on your current medications as directed. Please refer to the Current Medication list given to you today.  *If you need a refill on your cardiac medications before your next appointment, please call your pharmacy*   Lab Work:  THIS WEEK SOMETIME HERE IN THE OFFICE--LIPIDS AND LFTs--PLEASE COME FASTING TO THIS LAB APPOINTMENT  If you have labs (blood work) drawn today and your tests are completely normal, you will receive your results only by: MyChart Message (if you have MyChart) OR A paper copy in the mail If you have any lab test that is abnormal or we need to change your treatment, we will call you to review the results.   Follow-Up: At [facility], you and your health needs are our priority.  As part of our continuing mission to provide you with exceptional heart care, we have created designated Provider Care Teams.  These Care Teams include your primary Cardiologist (physician) and Advanced Practice Providers (APPs -  Physician Assistants  and Nurse Practitioners) who all work together to provide you with the care you need, when you need it.  We recommend signing up for the patient portal called "MyChart".  Sign up information is provided on this After Visit Summary.  MyChart is used  to connect with patients for Virtual Visits (Telemedicine).  Patients are able to view lab/test results, encounter notes, upcoming appointments, etc.  Non-urgent messages can be sent to your provider as well.   To learn more about what you can do with MyChart, go to [email or web address].    Your next appointment:   6 month(s)  The format for your next appointment:   In Person  Provider:   DR. Shari Prows OR AN EXTENDER   Important Information About Sugar         Signed, Meriam Sprague, MD  08/24/2021 9:23 AM    [facility]

## 2021-08-24 ENCOUNTER — Ambulatory Visit: Payer: No Typology Code available for payment source | Admitting: Cardiology

## 2021-08-24 ENCOUNTER — Encounter: Payer: Self-pay | Admitting: Cardiology

## 2021-08-24 VITALS — BP 108/62 | HR 80 | Ht 70.0 in | Wt 268.8 lb

## 2021-08-24 DIAGNOSIS — E785 Hyperlipidemia, unspecified: Secondary | ICD-10-CM | POA: Diagnosis not present

## 2021-08-24 DIAGNOSIS — Z79899 Other long term (current) drug therapy: Secondary | ICD-10-CM

## 2021-08-24 DIAGNOSIS — E782 Mixed hyperlipidemia: Secondary | ICD-10-CM

## 2021-08-24 DIAGNOSIS — I10 Essential (primary) hypertension: Secondary | ICD-10-CM | POA: Diagnosis not present

## 2021-08-24 DIAGNOSIS — R7989 Other specified abnormal findings of blood chemistry: Secondary | ICD-10-CM

## 2021-08-24 DIAGNOSIS — Z6838 Body mass index (BMI) 38.0-38.9, adult: Secondary | ICD-10-CM

## 2021-08-24 DIAGNOSIS — G4733 Obstructive sleep apnea (adult) (pediatric): Secondary | ICD-10-CM

## 2021-08-24 DIAGNOSIS — E6609 Other obesity due to excess calories: Secondary | ICD-10-CM

## 2021-08-24 NOTE — Patient Instructions (Signed)
Medication Instructions:   Your physician recommends that you continue on your current medications as directed. Please refer to the Current Medication list given to you today.  *If you need a refill on your cardiac medications before your next appointment, please call your pharmacy*   Lab Work:  THIS WEEK SOMETIME HERE IN THE OFFICE--LIPIDS AND LFTs--PLEASE COME FASTING TO THIS LAB APPOINTMENT  If you have labs (blood work) drawn today and your tests are completely normal, you will receive your results only by: MyChart Message (if you have MyChart) OR A paper copy in the mail If you have any lab test that is abnormal or we need to change your treatment, we will call you to review the results.   Follow-Up: At [facility], you and your health needs are our priority.  As part of our continuing mission to provide you with exceptional heart care, we have created designated Provider Care Teams.  These Care Teams include your primary Cardiologist (physician) and Advanced Practice Providers (APPs -  Physician Assistants and Nurse Practitioners) who all work together to provide you with the care you need, when you need it.  We recommend signing up for the patient portal called "MyChart".  Sign up information is provided on this After Visit Summary.  MyChart is used to connect with patients for Virtual Visits (Telemedicine).  Patients are able to view lab/test results, encounter notes, upcoming appointments, etc.  Non-urgent messages can be sent to your provider as well.   To learn more about what you can do with MyChart, go to [email or web address].    Your next appointment:   6 month(s)  The format for your next appointment:   In Person  Provider:   DR. Shari Prows OR AN EXTENDER   Important Information About Sugar

## 2021-08-27 ENCOUNTER — Other Ambulatory Visit: Payer: Self-pay

## 2021-08-27 MED ORDER — CARVEDILOL 6.25 MG PO TABS: 6.2500 mg | ORAL_TABLET | ORAL | 3 refills | Status: DC | PRN

## 2021-08-31 ENCOUNTER — Other Ambulatory Visit: Payer: No Typology Code available for payment source

## 2021-08-31 DIAGNOSIS — E782 Mixed hyperlipidemia: Secondary | ICD-10-CM

## 2021-08-31 DIAGNOSIS — E785 Hyperlipidemia, unspecified: Secondary | ICD-10-CM

## 2021-08-31 DIAGNOSIS — Z79899 Other long term (current) drug therapy: Secondary | ICD-10-CM

## 2021-08-31 LAB — HEPATIC FUNCTION PANEL
ALT: 21 IU/L (ref 0–44)
AST: 15 IU/L (ref 0–40)
Albumin: 4.4 g/dL (ref 4.1–5.1)
Alkaline Phosphatase: 131 IU/L — ABNORMAL HIGH (ref 44–121)
Bilirubin Total: 0.3 mg/dL (ref 0.0–1.2)
Bilirubin, Direct: <0.1 mg/dL (ref 0.00–0.40)
Total Protein: 7.1 g/dL (ref 6.0–8.5)

## 2021-08-31 LAB — LIPID PANEL
Chol/HDL Ratio: 4.7 ratio (ref 0.0–5.0)
Cholesterol, Total: 191 mg/dL (ref 100–199)
HDL: 41 mg/dL (ref >39)
LDL Chol Calc (NIH): 132 mg/dL — ABNORMAL HIGH (ref 0–99)
Triglycerides: 96 mg/dL (ref 0–149)
VLDL Cholesterol Cal: 18 mg/dL (ref 5–40)

## 2021-09-08 ENCOUNTER — Encounter (INDEPENDENT_AMBULATORY_CARE_PROVIDER_SITE_OTHER): Payer: Self-pay

## 2021-10-06 ENCOUNTER — Encounter: Payer: Self-pay | Admitting: Internal Medicine

## 2021-10-20 ENCOUNTER — Other Ambulatory Visit: Payer: Self-pay

## 2021-10-20 MED ORDER — CARVEDILOL 6.25 MG PO TABS: 6.2500 mg | ORAL_TABLET | ORAL | 3 refills | Status: DC | PRN

## 2021-11-09 ENCOUNTER — Encounter: Payer: Self-pay | Admitting: Internal Medicine

## 2021-11-15 ENCOUNTER — Encounter: Payer: No Typology Code available for payment source | Admitting: Physician Assistant

## 2021-12-14 ENCOUNTER — Encounter: Payer: Self-pay | Admitting: Internal Medicine

## 2022-02-22 ENCOUNTER — Other Ambulatory Visit: Payer: Self-pay | Admitting: Physician Assistant

## 2022-02-22 NOTE — Telephone Encounter (Signed)
Pt scheduled a med check to get refill. We couldn't find a good day and enough time to schedule his physical.

## 2022-02-28 ENCOUNTER — Other Ambulatory Visit: Payer: Self-pay | Admitting: Physician Assistant

## 2022-02-28 NOTE — Telephone Encounter (Signed)
Refill request last apt 05/14/21 next apt 03/08/22.

## 2022-03-01 ENCOUNTER — Other Ambulatory Visit: Payer: Self-pay | Admitting: Physician Assistant

## 2022-03-01 NOTE — Telephone Encounter (Signed)
Called patient and he has appt with you next week 03/08/22. He does need these refilled prior to that appt.

## 2022-03-08 ENCOUNTER — Ambulatory Visit: Payer: No Typology Code available for payment source | Admitting: Nurse Practitioner

## 2022-03-08 ENCOUNTER — Encounter: Payer: Self-pay | Admitting: Nurse Practitioner

## 2022-03-08 VITALS — BP 150/90 | HR 68 | Ht 70.0 in | Wt 249.4 lb

## 2022-03-08 DIAGNOSIS — F32A Depression, unspecified: Secondary | ICD-10-CM

## 2022-03-08 DIAGNOSIS — I10 Essential (primary) hypertension: Secondary | ICD-10-CM

## 2022-03-08 DIAGNOSIS — F419 Anxiety disorder, unspecified: Secondary | ICD-10-CM | POA: Diagnosis not present

## 2022-03-08 MED ORDER — PHENTERMINE HCL 37.5 MG PO TABS: 37.5000 mg | ORAL_TABLET | Freq: Every day | ORAL | 2 refills | Status: DC

## 2022-03-08 MED ORDER — AMLODIPINE BESYLATE 10 MG PO TABS: 10.0000 mg | ORAL_TABLET | Freq: Every day | ORAL | 3 refills | Status: DC

## 2022-03-08 MED ORDER — BUSPIRONE HCL 15 MG PO TABS: 15.0000 mg | ORAL_TABLET | Freq: Two times a day (BID) | ORAL | 3 refills | Status: DC

## 2022-03-08 NOTE — Progress Notes (Signed)
  Kurt Keeler, DNP, AGNP-c [place]  [address] [place], [place] [address] [place] and/or Follow-Up Visit  Blood pressure (!) 150/90, pulse 68, height 5\' 10"  (1.778 m), weight 249 lb 6.4 oz (113.1 kg), SpO2 98 %.    Kurt Gomez is a 37 y.o. year old male presenting today for evaluation and management of the following: Weight He tells me that he has been doing well since bariatric surgery. It has been a year since the surgery and he tells me that his   Bariatric surgeon took off hctz and losartan. They later took off spironolactone.  All ROS negative with exception of what is listed above.   PHYSICAL EXAM Physical Exam  PLAN Problem List Items Addressed This Visit   None   No follow-ups on file.   Kurt Keeler, DNP, AGNP-c 03/08/2022  3:15 PM

## 2022-03-10 NOTE — Assessment & Plan Note (Signed)
Chronic. He has lost considerable weight following bariatric surgery. At this time weight loss is stagnant. We discussed the option of phentermine to see if this would be helpful to facilitate increased weight loss. He will let me know if this is working for him. He will stop the medication if he has adverse side effects.

## 2022-03-10 NOTE — Assessment & Plan Note (Signed)
Chronic.  No alarm symptoms present at this time.  Goal blood pressure less than less than 130/80.  Refills have been provided today.  Labs today to check electrolytes and kidney function.  Recommend orders and follow up as documented in EpicCare, reviewed diet, exercise and weight control, labs ordered and recent results reviewed with patient. Currently is followed with cardiology. We will make changes to plan of care as necessary based on lab results. Plan to follow-up in 73months or as directed with cardiology. Will work to alternate with them.

## 2022-03-10 NOTE — Assessment & Plan Note (Signed)
chronic. - anxiety screen reviewed - depression screen reviewed - medication list reviewed medication list reviewed. Alarm symptoms absent today. Recommend continue current medications and - call doctor before stopping medicine for ongoing management. Instructed patient to contact office or on-call physician promptly should condition worsen or any new symptoms appear and provided on-call telephone numbers. IF THE PATIENT HAS ANY SUICIDAL OR HOMICIDAL IDEATIONS, CALL THE OFFICE, DISCUSS WITH A SUPPORT MEMBER, OR GO TO THE ER IMMEDIATELY. Patient was agreeable with this plan.. Follow-up in 1 year

## 2022-03-10 NOTE — Assessment & Plan Note (Signed)
>>  ASSESSMENT AND PLAN FOR MORBID OBESITY (HCC) WRITTEN ON 03/10/2022 10:49 PM BY Salif Tay E, NP  Chronic. He has lost considerable weight following bariatric surgery. At this time weight loss is stagnant. We discussed the option of phentermine to see if this would be helpful to facilitate increased weight loss. He will let me know if this is working for him. He will stop the medication if he has adverse side effects.

## 2022-07-06 ENCOUNTER — Encounter: Payer: Self-pay | Admitting: Nurse Practitioner

## 2022-07-06 MED ORDER — PHENTERMINE HCL 37.5 MG PO TABS: 37.5000 mg | ORAL_TABLET | Freq: Every day | ORAL | 2 refills | Status: DC

## 2022-08-28 IMAGING — DX DG CHEST 2V
2 series · 2 of 2 positions shown · non-contrast
Comparison: Chest radiograph dated 12/03/2019.

CLINICAL DATA: Preop chest radiograph.

EXAM:
CHEST - 2 VIEW

[chest pa]
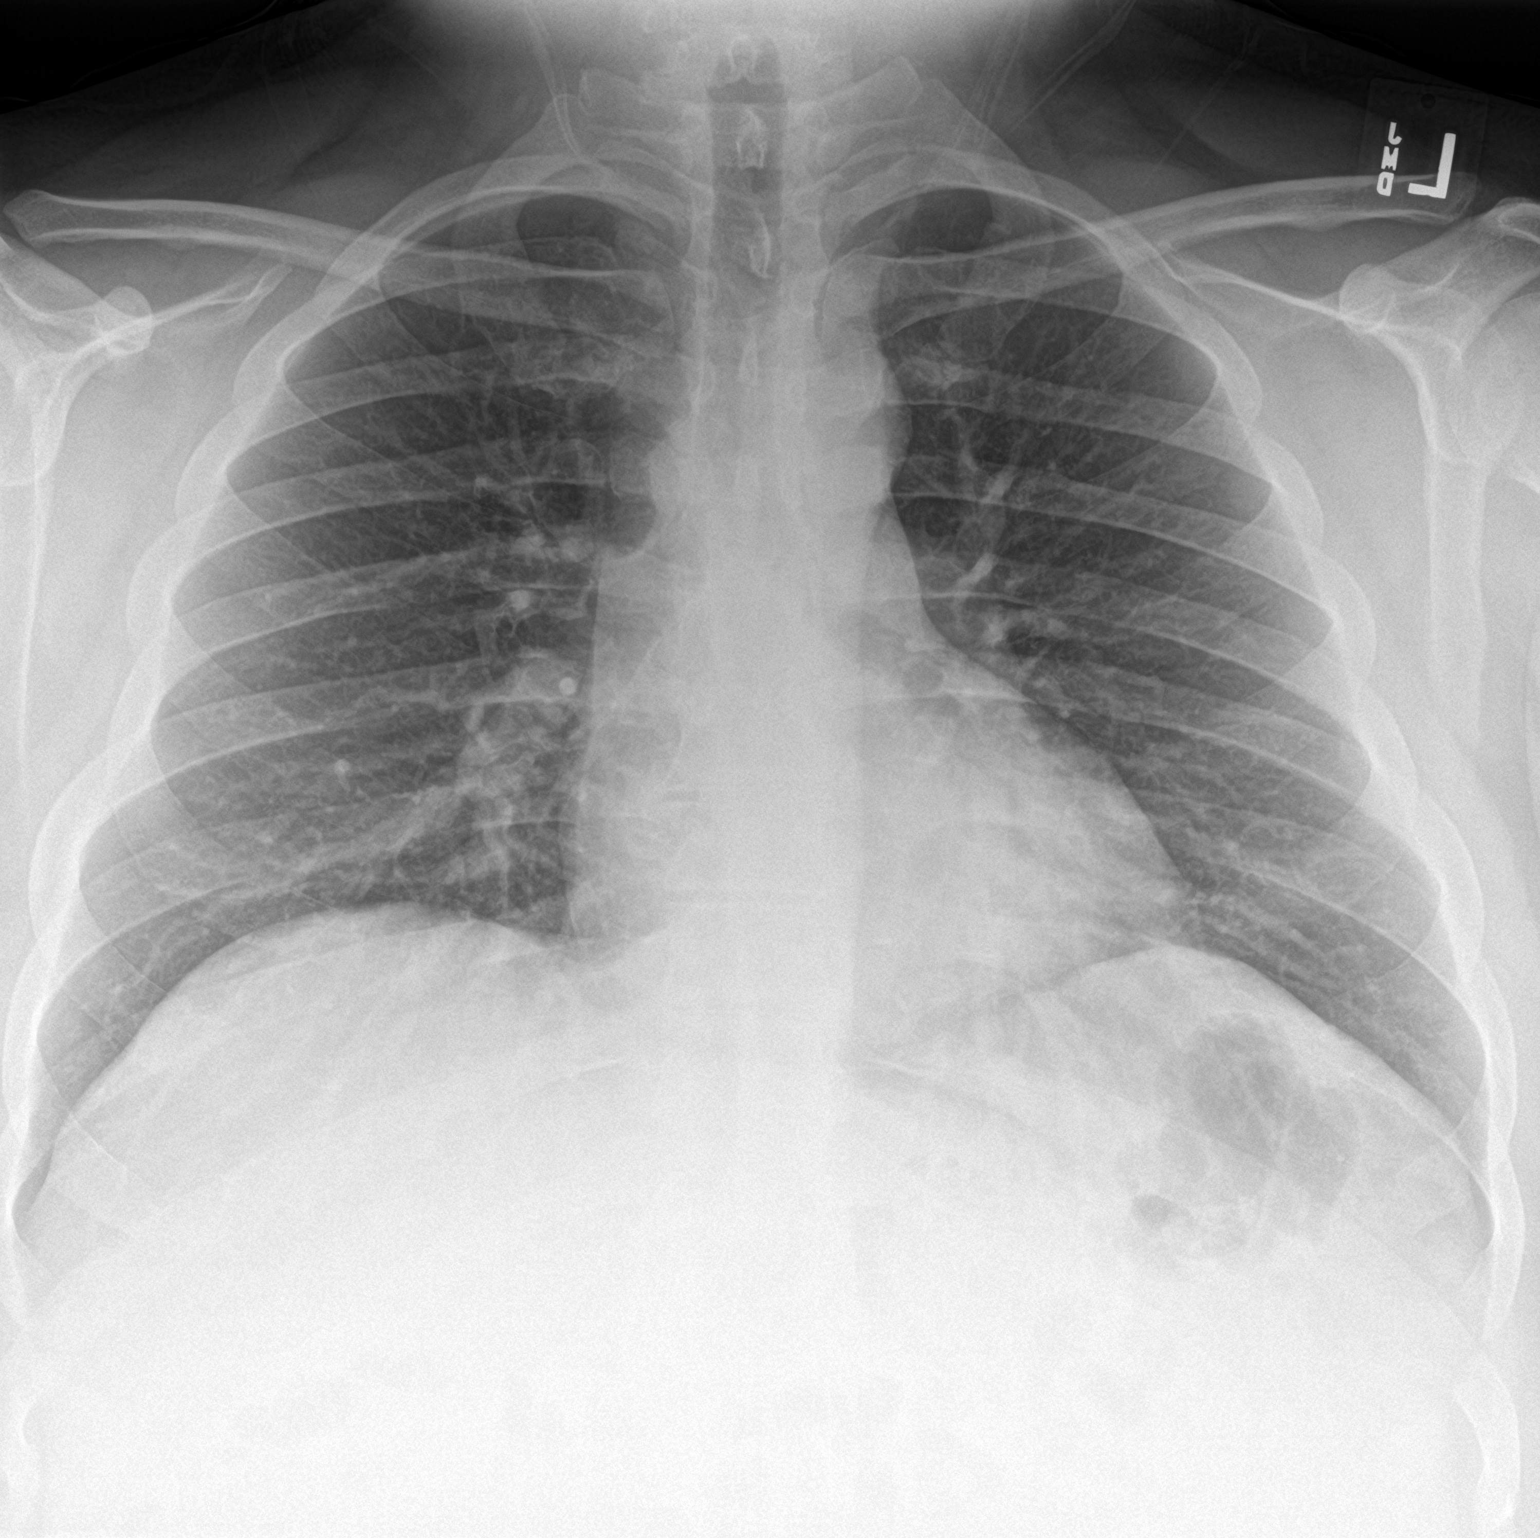

[chest lat]
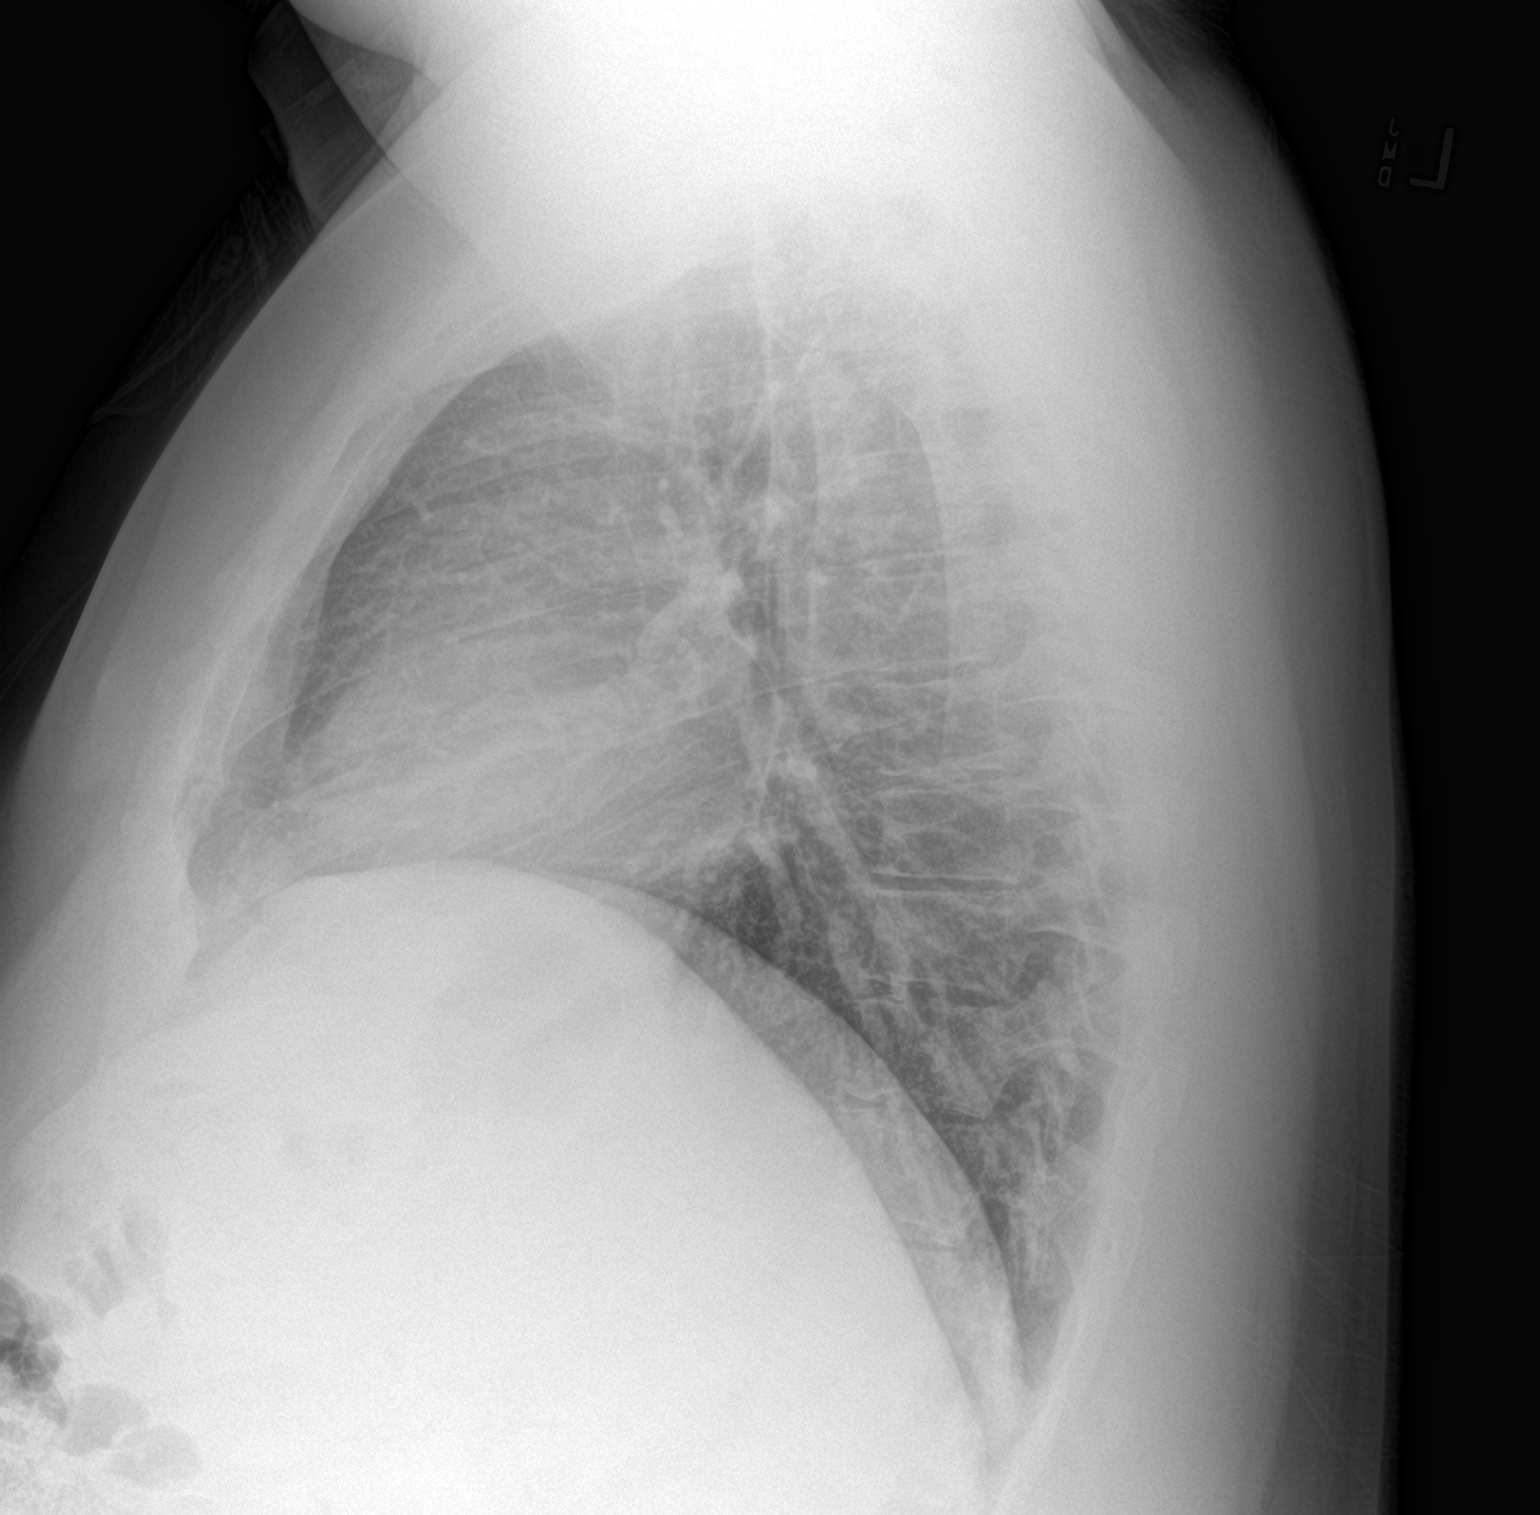

[2 of 2 positions shown; findings below may reference images not displayed]

FINDINGS: The heart size and mediastinal contours are within normal limits.
Both lungs are clear. The visualized skeletal structures are
unremarkable.
IMPRESSION: No active cardiopulmonary disease.

## 2022-11-10 ENCOUNTER — Encounter (HOSPITAL_COMMUNITY): Payer: Self-pay | Admitting: *Deleted

## 2022-11-24 ENCOUNTER — Other Ambulatory Visit: Payer: Self-pay

## 2022-11-24 ENCOUNTER — Other Ambulatory Visit: Payer: Self-pay | Admitting: Nurse Practitioner

## 2022-11-24 MED ORDER — CARVEDILOL 6.25 MG PO TABS: 6.2500 mg | ORAL_TABLET | ORAL | 0 refills | Status: DC | PRN

## 2022-11-24 NOTE — Telephone Encounter (Signed)
Last apt 03/08/22

## 2023-02-18 ENCOUNTER — Other Ambulatory Visit: Payer: Self-pay | Admitting: Nurse Practitioner

## 2023-02-18 DIAGNOSIS — I10 Essential (primary) hypertension: Secondary | ICD-10-CM

## 2023-02-19 ENCOUNTER — Encounter: Payer: Self-pay | Admitting: Nurse Practitioner

## 2023-02-19 ENCOUNTER — Other Ambulatory Visit: Payer: Self-pay | Admitting: Physician Assistant

## 2023-02-20 ENCOUNTER — Other Ambulatory Visit: Payer: Self-pay

## 2023-02-20 ENCOUNTER — Telehealth: Payer: Self-pay | Admitting: Nurse Practitioner

## 2023-02-20 MED ORDER — CARVEDILOL 6.25 MG PO TABS: 6.2500 mg | ORAL_TABLET | ORAL | 1 refills | Status: DC | PRN

## 2023-02-20 NOTE — Telephone Encounter (Signed)
Pt scheduled cpe for March    needs refill Carvedilol  ( he said SB was going to take over prescribing this for him)a

## 2023-03-14 ENCOUNTER — Other Ambulatory Visit: Payer: Self-pay | Admitting: Nurse Practitioner

## 2023-03-14 DIAGNOSIS — I10 Essential (primary) hypertension: Secondary | ICD-10-CM

## 2023-04-07 NOTE — Progress Notes (Unsigned)
 There were no vitals taken for this visit.   Subjective:    Patient ID: Kurt Gomez, male    DOB: September 24, 1985, 38 y.o.   MRN: [id number]  HPI: Kurt Gomez is a 38 y.o. male presenting on 04/10/2023 for comprehensive medical examination.     Pertinent items are noted in HPI.  IMMUNIZATIONS:   Flu Vaccine: Flu vaccine given today COVID: Declined, patient does not wish to have this vaccine   HEALTH MAINTENANCE: None overdue  Most Recent Depression Screen:     05/14/2021    3:47 PM 09/09/2020    3:13 PM 04/14/2020    1:35 PM 12/05/2019    4:35 PM 10/15/2019    3:05 PM  Depression screen PHQ 2/9  Decreased Interest 0 0  0 0  Down, Depressed, Hopeless 0 0  0 0  PHQ - 2 Score 0 0  0 0  Altered sleeping       Tired, decreased energy       Change in appetite       Feeling bad or failure about yourself        Trouble concentrating       Moving slowly or fidgety/restless       Suicidal thoughts       PHQ-9 Score       Difficult doing work/chores          Information is confidential and restricted. Go to Review Flowsheets to unlock data.   Most Recent Anxiety Screen:    11/14/2019    3:01 PM  GAD 7 : Generalized Anxiety Score  Nervous, Anxious, on Edge 1  Control/stop worrying 1  Worry too much - different things 1  Trouble relaxing 2  Restless 1  Easily annoyed or irritable 0  Afraid - awful might happen 0  Total GAD 7 Score 6  Anxiety Difficulty Somewhat difficult   Most Recent Falls Screen:    03/08/2022    3:15 PM 05/14/2021    3:47 PM 09/09/2020    3:12 PM 12/05/2019    4:34 PM 10/15/2019    3:05 PM  Fall Risk   Falls in the past year? 0 0 0 0 0  Number falls in past yr: 0 0 0  0  Injury with Fall? 0 0 0  0  Risk for fall due to : No Fall Risks No Fall Risks No Fall Risks    Follow up Falls evaluation completed Falls evaluation completed Falls evaluation completed      Past medical history, surgical history, medications, allergies, family history and social  history reviewed with patient today and changes made to appropriate areas of the chart.  Past Medical History:  Past Medical History:  Diagnosis Date   Anxiety and depression    At risk for sleep apnea    Elevated liver function tests    Family history of adverse reaction to anesthesia    mother PONV   GERD (gastroesophageal reflux disease)    HTN (hypertension)    Hyperlipidemia    Morbid obesity (HCC)    Pre-diabetes    Sleep apnea    Uncontrolled hypertension 11/14/2019   Medications:  Current Outpatient Medications on File Prior to Visit  Medication Sig   amLODipine (NORVASC) 10 MG tablet TAKE 1 TABLET BY MOUTH EVERY DAY   buPROPion (WELLBUTRIN XL) 300 MG 24 hr tablet TAKE 1 TABLET BY MOUTH EVERY DAY   busPIRone (BUSPAR) 15 MG tablet Take 1 tablet (15 mg total) by  mouth 2 (two) times daily.   carvedilol (COREG) 6.25 MG tablet TAKE 1 TABLET AS NEEDED 2 TIMES A DAY, TO TAKE IF NEEDED IF BLOOD PRESSURE REMAINS ELEVATED DESPITE INITIATION OF SPRINOLACTONE   Multiple Vitamins-Minerals (MULTIVITAMIN WITH MINERALS) tablet Take 1 tablet by mouth daily.   pantoprazole (PROTONIX) 40 MG tablet TAKE 1 TABLET BY MOUTH EVERY DAY   phentermine (ADIPEX-P) 37.5 MG tablet Take 1 tablet (37.5 mg total) by mouth daily before breakfast.   No current facility-administered medications on file prior to visit.   Surgical History:  Past Surgical History:  Procedure Laterality Date   LAPAROSCOPIC GASTRIC SLEEVE RESECTION N/A 04/05/2021   Procedure: LAPAROSCOPIC GASTRIC SLEEVE RESECTION WITH HIATAL HERNIA REPAIR;  Surgeon: Berna Bue, MD;  Location: [facility];  Service: General;  Laterality: N/A;   TONSILLECTOMY AND ADENOIDECTOMY     UPPER GI ENDOSCOPY N/A 04/05/2021   Procedure: UPPER GI ENDOSCOPY;  Surgeon: Berna Bue, MD;  Location: [facility];  Service: General;  Laterality: N/A;   WISDOM TOOTH EXTRACTION     Allergies:  No Known Allergies Social History:  Social History   Socioeconomic  History   Marital status: Single    Spouse name: Not on file   Number of children: Not on file   Years of education: Not on file   Highest education level: Doctorate  Occupational History   Not on file  Tobacco Use   Smoking status: Former    Current packs/day: 0.00    Average packs/day: 0.5 packs/day for 10.0 years (5.0 ttl pk-yrs)    Types: Cigarettes    Start date: 08/14/2009    Quit date: 08/15/2019    Years since quitting: 3.6   Smokeless tobacco: Never  Vaping Use   Vaping status: Never Used  Substance and Sexual Activity   Alcohol use: Never   Drug use: Never   Sexual activity: Not Currently  Other Topics Concern   Not on file  Social History Narrative   Not on file   Social Drivers of Health   Financial Resource Strain: Low Risk  (04/06/2023)   Overall Financial Resource Strain (CARDIA)    Difficulty of Paying Living Expenses: Not hard at all  Food Insecurity: No Food Insecurity (04/06/2023)   Hunger Vital Sign    Worried About Running Out of Food in the Last Year: Never true    Ran Out of Food in the Last Year: Never true  Transportation Needs: No Transportation Needs (04/06/2023)   PRAPARE - Administrator, Civil Service (Medical): No    Lack of Transportation (Non-Medical): No  Physical Activity: Sufficiently Active (04/06/2023)   Exercise Vital Sign    Days of Exercise per Week: 5 days    Minutes of Exercise per Session: 60 min  Stress: Stress Concern Present (04/06/2023)   [employer] of Occupational Health - Occupational Stress Questionnaire    Feeling of Stress : To some extent  Social Connections: Socially Isolated (04/06/2023)   Social Connection and Isolation Panel [NHANES]    Frequency of Communication with Friends and Family: More than three times a week    Frequency of Social Gatherings with Friends and Family: More than three times a week    Attends Religious Services: Never    Database administrator or Organizations: No    Attends Probation officer: Not on file    Marital Status: Never married  Intimate Partner Violence: Not on file   Social History  Tobacco Use  Smoking Status Former   Current packs/day: 0.00   Average packs/day: 0.5 packs/day for 10.0 years (5.0 ttl pk-yrs)   Types: Cigarettes   Start date: 08/14/2009   Quit date: 08/15/2019   Years since quitting: 3.6  Smokeless Tobacco Never   Social History   Substance and Sexual Activity  Alcohol Use Never   Family History:  Family History  Problem Relation Age of Onset   Depression Mother    Diabetes Father    Hyperlipidemia Father    Hypertension Father        Objective:    There were no vitals taken for this visit.  Wt Readings from Last 3 Encounters:  03/08/22 249 lb 6.4 oz (113.1 kg)  08/24/21 268 lb 12.8 oz (121.9 kg)  05/14/21 291 lb 12.8 oz (132.4 kg)    Physical Exam   Results for orders placed or performed in visit on 08/31/21  Lipid Profile   Collection Time: 08/31/21  7:23 AM  Result Value Ref Range   Cholesterol, Total 191 100 - 199 mg/dL   Triglycerides 96 0 - 149 mg/dL   HDL 41 >16 mg/dL   VLDL Cholesterol Cal 18 5 - 40 mg/dL   LDL Chol Calc (NIH) 109 (H) 0 - 99 mg/dL   Chol/HDL Ratio 4.7 0.0 - 5.0 ratio  Hepatic function panel   Collection Time: 08/31/21  7:23 AM  Result Value Ref Range   Total Protein 7.1 6.0 - 8.5 g/dL   Albumin 4.4 4.1 - 5.1 g/dL   Bilirubin Total 0.3 0.0 - 1.2 mg/dL   Bilirubin, Direct <6.04 0.00 - 0.40 mg/dL   Alkaline Phosphatase 131 (H) 44 - 121 IU/L   AST 15 0 - 40 IU/L   ALT 21 0 - 44 IU/L      Assessment & Plan:   Problem List Items Addressed This Visit   None    Follow up plan: NEXT PREVENTATIVE PHYSICAL DUE IN 1 YEAR. No follow-ups on file.  LABORATORY TESTING:  Health maintenance labs ordered today, if applicable.    PATIENT COUNSELING:   For all adult patients, I recommend A well balanced diet low in saturated fats, cholesterol, and moderation in  carbohydrates.  This can be as simple as monitoring portion sizes and cutting back on sugary beverages such as soda and juice to start with.    Daily water consumption of at least 64 ounces.  Physical activity at least 180 minutes per week, if just starting out.  This can be as simple as taking the stairs instead of the elevator and walking 2-3 laps around the office  purposefully every day.   STD protection, partner selection, and regular testing if high risk.  Limited consumption of alcoholic beverages if alcohol is consumed. For men, I recommend no more than 14 alcoholic beverages per week, spread out throughout the week (max 2 per day). Avoid "binge" drinking or consuming large quantities of alcohol in one setting.  Please let me know if you feel you may need help with reduction or quitting alcohol consumption.   Avoidance of nicotine, if used. Please let me know if you feel you may need help with reduction or quitting nicotine use.   Daily mental health attention. This can be in the form of 5 minute daily meditation, prayer, journaling, yoga, reflection, etc.  Purposeful attention to your emotions and mental state can significantly improve your overall wellbeing and Health.  Please know that I am here to help  you with all of your health care goals and am happy to work with you to find a solution that works best for you.  The greatest advice I have received with any changes in life are to take it one step at a time, that even means if all you can focus on is the next 60 seconds, then do that and celebrate your victories.  With any changes in life, you will have set backs, and that is OK. The important thing to remember is, if you have a set back, it is not a failure, it is an opportunity to try again!  Health Maintenance Recommendations Screening Testing Mammogram Every 1 -2 years based on history and risk factors Starting at age 30 Pap Smear Ages 21-39 every 3 years Ages 11-65  every 5 years with HPV testing More frequent testing may be required based on results and history Colon Cancer Screening Every 1-10 years based on test performed, risk factors, and history Starting at age 50 Bone Density Screening Every 2-10 years based on history Starting at age 61 for women Recommendations for men differ based on medication usage, history, and risk factors AAA Screening One time ultrasound Men 18-64 years old who have every smoked Lung Cancer Screening Low Dose Lung CT every 12 months Age 76-80 years with a 30 pack-year smoking history who still smoke or who have quit within the last 15 years   Screening Labs Routine  Labs: Complete Blood Count (CBC), Complete Metabolic Panel (CMP), Cholesterol (Lipid Panel) Every 6-12 months based on history and medications May be recommended more frequently based on current conditions or previous results Hemoglobin A1c Lab Every 3-12 months based on history and previous results Starting at age 380 or earlier with diagnosis of diabetes, high cholesterol, BMI >26, and/or risk factors Frequent monitoring for patients with diabetes to ensure blood sugar control Thyroid Panel (TSH) Every 6 months based on history, symptoms, and risk factors May be repeated more often if on medication HIV One time testing for all patients 66 and older May be repeated more frequently for patients with increased risk factors or exposure Hepatitis C One time testing for all patients 37 and older May be repeated more frequently for patients with increased risk factors or exposure Gonorrhea, Chlamydia Every 12 months for all sexually active persons 13-24 years Additional monitoring may be recommended for those who are considered high risk or who have symptoms Every 12 months for any woman on birth control, regardless of sexual activity PSA Men 61-51 years old with risk factors Additional screening may be recommended from age 39-69 based on risk  factors, symptoms, and history  Vaccine Recommendations Tetanus Booster All adults every 10 years Flu Vaccine All patients 6 months and older every year COVID Vaccine All patients 12 years and older Initial dosing with booster May recommend additional booster based on age and health history HPV Vaccine 2 doses all patients age 38-26 Dosing may be considered for patients over 26 Shingles Vaccine (Shingrix) 2 doses all adults 55 years and older Pneumonia (Pneumovax 52) All adults 65 years and older May recommend earlier dosing based on health history One year apart from Prevnar 46 Pneumonia (Prevnar 78) All adults 65 years and older Dosed 1 year after Pneumovax 23 Pneumonia (Prevnar 20) One time alternative to the two dosing of 13 and 23 For all adults with initial dose of 23, 20 is recommended 1 year later For all adults with initial dose of 13, 23 is still recommended  as second option 1 year later  Additional Screening, Testing, and Vaccinations may be recommended on an individualized basis based on family history, health history, risk factors, and/or exposure.

## 2023-04-10 ENCOUNTER — Encounter: Payer: Self-pay | Admitting: Nurse Practitioner

## 2023-04-10 ENCOUNTER — Ambulatory Visit (INDEPENDENT_AMBULATORY_CARE_PROVIDER_SITE_OTHER): Payer: No Typology Code available for payment source | Admitting: Nurse Practitioner

## 2023-04-10 ENCOUNTER — Telehealth: Payer: Self-pay | Admitting: Internal Medicine

## 2023-04-10 VITALS — BP 123/81 | HR 82 | Resp 16 | Ht 70.5 in | Wt 240.8 lb

## 2023-04-10 DIAGNOSIS — R7303 Prediabetes: Secondary | ICD-10-CM

## 2023-04-10 DIAGNOSIS — G4733 Obstructive sleep apnea (adult) (pediatric): Secondary | ICD-10-CM | POA: Diagnosis not present

## 2023-04-10 DIAGNOSIS — F32A Depression, unspecified: Secondary | ICD-10-CM

## 2023-04-10 DIAGNOSIS — Z0000 Encounter for general adult medical examination without abnormal findings: Secondary | ICD-10-CM | POA: Insufficient documentation

## 2023-04-10 DIAGNOSIS — F419 Anxiety disorder, unspecified: Secondary | ICD-10-CM

## 2023-04-10 DIAGNOSIS — I10 Essential (primary) hypertension: Secondary | ICD-10-CM | POA: Diagnosis not present

## 2023-04-10 DIAGNOSIS — Z23 Encounter for immunization: Secondary | ICD-10-CM

## 2023-04-10 DIAGNOSIS — N189 Chronic kidney disease, unspecified: Secondary | ICD-10-CM

## 2023-04-10 DIAGNOSIS — K219 Gastro-esophageal reflux disease without esophagitis: Secondary | ICD-10-CM

## 2023-04-10 DIAGNOSIS — E782 Mixed hyperlipidemia: Secondary | ICD-10-CM

## 2023-04-10 DIAGNOSIS — E66813 Obesity, class 3: Secondary | ICD-10-CM

## 2023-04-10 DIAGNOSIS — Z9109 Other allergy status, other than to drugs and biological substances: Secondary | ICD-10-CM | POA: Insufficient documentation

## 2023-04-10 DIAGNOSIS — R7989 Other specified abnormal findings of blood chemistry: Secondary | ICD-10-CM

## 2023-04-10 DIAGNOSIS — F900 Attention-deficit hyperactivity disorder, predominantly inattentive type: Secondary | ICD-10-CM

## 2023-04-10 MED ORDER — AMLODIPINE BESYLATE 10 MG PO TABS: 10.0000 mg | ORAL_TABLET | Freq: Every day | ORAL | 3 refills | Status: AC

## 2023-04-10 MED ORDER — AMPHETAMINE-DEXTROAMPHETAMINE 10 MG PO TABS
10.0000 mg | ORAL_TABLET | Freq: Two times a day (BID) | ORAL | 0 refills | Status: DC
Start: 2023-04-10 — End: 2023-05-08

## 2023-04-10 MED ORDER — BUPROPION HCL ER (XL) 300 MG PO TB24: 300.0000 mg | ORAL_TABLET | Freq: Every day | ORAL | 3 refills | Status: AC

## 2023-04-10 MED ORDER — CARVEDILOL 6.25 MG PO TABS: 6.2500 mg | ORAL_TABLET | Freq: Two times a day (BID) | ORAL | 3 refills | Status: AC

## 2023-04-10 MED ORDER — MONTELUKAST SODIUM 10 MG PO TABS: 10.0000 mg | ORAL_TABLET | Freq: Every day | ORAL | 3 refills | Status: DC

## 2023-04-10 MED ORDER — BUSPIRONE HCL 15 MG PO TABS: 15.0000 mg | ORAL_TABLET | Freq: Two times a day (BID) | ORAL | 3 refills | Status: AC

## 2023-04-10 MED ORDER — PANTOPRAZOLE SODIUM 40 MG PO TBEC: 40.0000 mg | DELAYED_RELEASE_TABLET | Freq: Every day | ORAL | 3 refills | Status: AC

## 2023-04-10 NOTE — Telephone Encounter (Signed)
 Please call patient and let him know, he is to fast 6 hours prior to labs

## 2023-04-10 NOTE — Patient Instructions (Addendum)
 VISIT SUMMARY:  Today, we discussed your attention and focus issues, which have been more noticeable since you stopped taking phentermine. We also reviewed your history of allergic rhinitis, obesity, gastroesophageal reflux disease (GERD), and hypertension. Additionally, we talked about general health maintenance, including vaccinations and STD testing.  The HPV Vaccine is a three part series. You will need a second dose in 1-2 months and the third dose 6 months (from today).   YOUR PLAN:  -ATTENTION-DEFICIT/HYPERACTIVITY DISORDER (ADHD): ADHD is a condition characterized by persistent patterns of inattention and/or hyperactivity-impulsivity. We will start you on Adderall 10 mg immediate release, to be taken in the morning and afternoon as needed. We will adjust the dosage based on your response, with the option to split doses or increase the morning dose if necessary. Please follow up in one month to assess the medication's efficacy and any side effects. We will follow-up in about 3 months with a virtual visit to make sure it is all going well! If things are stable, we can go to once every 6 months virtual visit check in.   -ALLERGIC RHINITIS: Allergic rhinitis is an allergic reaction that causes sneezing, congestion, and a runny nose. You have had success with Singulair in the past, so we will prescribe it again to manage your symptoms.  -OBESITY: Obesity is a condition characterized by excessive body fat. Since phentermine was not effective for weight loss, we will discontinue its use.  -GASTROESOPHAGEAL REFLUX DISEASE (GERD): GERD is a chronic condition where stomach acid frequently flows back into the esophagus, causing irritation. Your symptoms are generally well-managed with pantoprazole, and you can supplement with Tums or Pepcid as needed.  -HYPERTENSION: Hypertension is high blood pressure. Your blood pressure is well-controlled with carvedilol 6.25 mg taken twice a day, so we will continue  this medication.  -GENERAL HEALTH MAINTENANCE: We discussed the importance of vaccinations and STD testing. You will receive the flu vaccine and the Gardasil 9 (HPV vaccine). We will also perform STD testing, including HIV, syphilis, and hepatitis via a blood test.  INSTRUCTIONS:  Please follow up in one month to assess the efficacy and side effects of your ADHD medication. Additionally, ensure you receive the flu vaccine and Gardasil 9 (HPV vaccine) today, and complete the STD testing as discussed.

## 2023-04-10 NOTE — Telephone Encounter (Signed)
 Copied from CRM [id number]. Topic: Appointments - Appointment Info/Confirmation >> Apr 10, 2023 10:56 AM Kurt Gomez wrote: Patient has appt today at 1:45p but forgot to fast, does not remember how long he was supposed fast for, please call patient [phone]

## 2023-04-10 NOTE — Telephone Encounter (Signed)
 Called pt advised of fasting 6 hours with water.  He at at 5:45 am and had a sugar free gatorade about 1 hour ago.  He will advise the provider.

## 2023-04-11 DIAGNOSIS — F900 Attention-deficit hyperactivity disorder, predominantly inattentive type: Secondary | ICD-10-CM | POA: Insufficient documentation

## 2023-04-11 LAB — CBC WITH DIFFERENTIAL/PLATELET
Basophils Absolute: 0 10*3/uL (ref 0.0–0.2)
Basos: 1 %
EOS (ABSOLUTE): 0.1 10*3/uL (ref 0.0–0.4)
Eos: 2 %
Hematocrit: 47.7 % (ref 37.5–51.0)
Hemoglobin: 16.1 g/dL (ref 13.0–17.7)
Immature Grans (Abs): 0 10*3/uL (ref 0.0–0.1)
Immature Granulocytes: 0 %
Lymphocytes Absolute: 2.5 10*3/uL (ref 0.7–3.1)
Lymphs: 48 %
MCH: 28.6 pg (ref 26.6–33.0)
MCHC: 33.8 g/dL (ref 31.5–35.7)
MCV: 85 fL (ref 79–97)
Monocytes Absolute: 0.5 10*3/uL (ref 0.1–0.9)
Monocytes: 9 %
Neutrophils Absolute: 2.1 10*3/uL (ref 1.4–7.0)
Neutrophils: 40 %
Platelets: 365 10*3/uL (ref 150–450)
RBC: 5.62 x10E6/uL (ref 4.14–5.80)
RDW: 13.2 % (ref 11.6–15.4)
WBC: 5.3 10*3/uL (ref 3.4–10.8)

## 2023-04-11 LAB — CMP14+EGFR
ALT: 30 IU/L (ref 0–44)
AST: 22 IU/L (ref 0–40)
Albumin: 4.8 g/dL (ref 4.1–5.1)
Alkaline Phosphatase: 115 IU/L (ref 44–121)
BUN/Creatinine Ratio: 13 (ref 9–20)
BUN: 14 mg/dL (ref 6–20)
Bilirubin Total: 0.3 mg/dL (ref 0.0–1.2)
CO2: 26 mmol/L (ref 20–29)
Calcium: 10.1 mg/dL (ref 8.7–10.2)
Chloride: 102 mmol/L (ref 96–106)
Creatinine, Ser: 1.05 mg/dL (ref 0.76–1.27)
Globulin, Total: 2.7 g/dL (ref 1.5–4.5)
Glucose: 88 mg/dL (ref 70–99)
Potassium: 4.3 mmol/L (ref 3.5–5.2)
Sodium: 142 mmol/L (ref 134–144)
Total Protein: 7.5 g/dL (ref 6.0–8.5)
eGFR: 95 mL/min/{1.73_m2} (ref >59)

## 2023-04-11 LAB — RPR: RPR Ser Ql: NONREACTIVE

## 2023-04-11 LAB — LIPID PANEL
Chol/HDL Ratio: 4.3 ratio (ref 0.0–5.0)
Cholesterol, Total: 239 mg/dL — ABNORMAL HIGH (ref 100–199)
HDL: 55 mg/dL (ref >39)
LDL Chol Calc (NIH): 167 mg/dL — ABNORMAL HIGH (ref 0–99)
Triglycerides: 96 mg/dL (ref 0–149)
VLDL Cholesterol Cal: 17 mg/dL (ref 5–40)

## 2023-04-11 LAB — HEMOGLOBIN A1C
Est. average glucose Bld gHb Est-mCnc: 103 mg/dL
Hgb A1c MFr Bld: 5.2 % (ref 4.8–5.6)

## 2023-04-11 LAB — HIV ANTIBODY (ROUTINE TESTING W REFLEX): HIV Screen 4th Generation wRfx: NONREACTIVE

## 2023-04-11 NOTE — Assessment & Plan Note (Signed)
 Repeat labs. Continue with weight management efforts.

## 2023-04-11 NOTE — Assessment & Plan Note (Signed)

## 2023-04-11 NOTE — Assessment & Plan Note (Signed)
 Occasional need to supplement pantoprazole with Tums. Symptoms are generally well-managed with current regimen. - Continue pantoprazole - Supplement with Tums or Pepcid as needed

## 2023-04-11 NOTE — Assessment & Plan Note (Signed)
 Reports that phentermine is not effective for weight loss and has discontinued its use. He has experienced a significant improvement in BMI with his efforts.  - Discontinue phentermine - Continue with diet and exercise.

## 2023-04-11 NOTE — Assessment & Plan Note (Signed)
 Blood pressure is well-controlled on carvedilol 6.25 mg twice a day. - Continue carvedilol 6.25 mg twice a day

## 2023-04-11 NOTE — Assessment & Plan Note (Signed)
 Reports attention issues and significant improvement in focus with phentermine. Positive ADHD screening based on self-assessment. Discussed medication options including amphetamine salts (Adderall, Vyvanse) and methylphenidate (Ritalin, Concerta). Explained differences between short-acting and long-acting formulations, and potential side effects such as palpitations and rare rage episodes. Plan to start with short-acting Adderall to find the right dose, with potential switch to Vyvanse if needed. - Prescribe Adderall 10 mg immediate release, to be taken in the morning and afternoon as needed - Adjust dosage based on response, with the option to split doses or increase morning dose if necessary - Follow up in one month to assess medication efficacy and side effects

## 2023-04-11 NOTE — Assessment & Plan Note (Signed)
 Reports exposure to a cat, which has previously triggered symptoms. Has had success with Singulair in the past. - Prescribe Singulair

## 2023-04-11 NOTE — Assessment & Plan Note (Signed)
 Labs pending. No current treatment. Monitor closely. DIet and exercise recommended for low fat, low carb options.

## 2023-04-11 NOTE — Assessment & Plan Note (Signed)
 Repeat labs. No alarm symptoms.

## 2023-04-11 NOTE — Assessment & Plan Note (Signed)
 Repeat labs:

## 2023-04-11 NOTE — Assessment & Plan Note (Signed)
 Good control with current regimen. No changes needed.

## 2023-04-17 ENCOUNTER — Encounter: Payer: Self-pay | Admitting: Nurse Practitioner

## 2023-05-07 ENCOUNTER — Encounter: Payer: Self-pay | Admitting: Nurse Practitioner

## 2023-05-07 DIAGNOSIS — F900 Attention-deficit hyperactivity disorder, predominantly inattentive type: Secondary | ICD-10-CM

## 2023-05-08 MED ORDER — AMPHETAMINE-DEXTROAMPHETAMINE 10 MG PO TABS: 10.0000 mg | ORAL_TABLET | Freq: Two times a day (BID) | ORAL | 0 refills | Status: DC

## 2023-05-15 ENCOUNTER — Encounter: Payer: Self-pay | Admitting: Nurse Practitioner

## 2023-05-16 ENCOUNTER — Telehealth: Payer: Self-pay

## 2023-05-16 NOTE — Telephone Encounter (Signed)
 Pt. Scheduled for 2nd HPV vaccine  Copied from CRM [id number]. Topic: Appointments - Scheduling Inquiry for Clinic >> May 16, 2023 12:44 PM Santiya F wrote: Reason for CRM: Patient is calling in to schedule the second dose of his HPV vaccine. Please follow up with patient.

## 2023-05-22 ENCOUNTER — Other Ambulatory Visit (INDEPENDENT_AMBULATORY_CARE_PROVIDER_SITE_OTHER)

## 2023-05-22 DIAGNOSIS — Z23 Encounter for immunization: Secondary | ICD-10-CM | POA: Diagnosis not present

## 2023-07-11 ENCOUNTER — Telehealth: Admitting: Nurse Practitioner

## 2023-08-28 ENCOUNTER — Encounter: Payer: Self-pay | Admitting: Nurse Practitioner

## 2023-08-28 ENCOUNTER — Telehealth: Admitting: Nurse Practitioner

## 2023-08-28 VITALS — Wt 235.0 lb

## 2023-08-28 DIAGNOSIS — Z9109 Other allergy status, other than to drugs and biological substances: Secondary | ICD-10-CM | POA: Diagnosis not present

## 2023-08-28 DIAGNOSIS — F909 Attention-deficit hyperactivity disorder, unspecified type: Secondary | ICD-10-CM

## 2023-08-28 DIAGNOSIS — F900 Attention-deficit hyperactivity disorder, predominantly inattentive type: Secondary | ICD-10-CM

## 2023-08-28 MED ORDER — AMPHETAMINE-DEXTROAMPHETAMINE 10 MG PO TABS: 10.0000 mg | ORAL_TABLET | Freq: Two times a day (BID) | ORAL | 0 refills | Status: DC

## 2023-08-28 MED ORDER — LEVOCETIRIZINE DIHYDROCHLORIDE 5 MG PO TABS: 5.0000 mg | ORAL_TABLET | Freq: Every evening | ORAL | 1 refills | Status: DC

## 2023-08-28 NOTE — Progress Notes (Signed)
 Virtual Visit Encounter mychart visit.   I connected with  Kurt Gomez on 08/28/23 at  3:30 PM EDT by secure video and audio telemedicine application. I verified that I am speaking with the correct person using two identifiers.   I introduced myself as a Publishing rights manager with the practice. The limitations of evaluation and management by telemedicine discussed with the patient and the availability of in person appointments. The patient expressed verbal understanding and consent to proceed.  Participating parties in this visit include: Myself and patient  The patient is: Patient Location: Home I am: Provider Location: Office/Clinic Subjective:    CC and HPI:  History of Present Illness Kurt Gomez is a 38 year old male who presents for medication management.  He is managing ADHD with a medication regimen that includes doses in the morning and afternoon, which he finds effective without interfering with his sleep. He chooses not to take the medication on weekends, feeling it unnecessary. No increased anxiety, palpitations, or chest pain. He reports improved focus and attention on tasks.  For allergies, he takes montelukast  (Singulair ), which is effective during the day but insufficient at night. He experiences nocturnal symptoms such as difficulty breathing and mouth breathing, disrupting his sleep. He has previously used levocetirizine (Xyzal ) with good results. He will be watching a cat.  He is preparing to start classes soon.  Past medical history, Surgical history, Family history not pertinant except as noted below, Social history, Allergies, and medications have been entered into the medical record, reviewed, and corrections made.   Review of Systems:  All review of systems negative except what is listed in the HPI  Objective:    Alert and oriented x 4 Speaking in clear sentences with no shortness of breath. No distress.  Impression and Recommendations:    Problem List Items  Addressed This Visit   None   orders and follow up as documented in EMR I discussed the assessment and treatment plan with the patient. The patient was provided an opportunity to ask questions and all were answered. The patient agreed with the plan and demonstrated an understanding of the instructions.   The patient was advised to call back or seek an in-person evaluation if the symptoms worsen or if the condition fails to improve as anticipated.  Follow-Up: in 6 months  I provided 16 minutes of non-face-to-face interaction with this non face-to-face encounter including intake, same-day documentation, and chart review.   Kurt CHARLENA Doing, NP , DNP, AGNP-c [facility]

## 2023-08-28 NOTE — Assessment & Plan Note (Signed)
 Allergic rhinitis symptoms are partially controlled with montelukast . He experiences nocturnal symptoms, including difficulty breathing and mouth breathing at night. Xyzal  is considered to enhance symptom control, especially for nighttime relief. Discussed potential side effects of Xyzal , including dryness and drowsiness, and advised taking it at bedtime to mitigate daytime drowsiness. - Add Xyzal  to the current regimen, to be taken at bedtime. - Monitor effectiveness of Xyzal  in combination with Singulair . - Consider nasal sprays if symptoms persist, especially when exposed to allergens like pets.

## 2023-08-28 NOTE — Assessment & Plan Note (Signed)
 ADHD is well-managed with the current medication regimen. He reports improved focus and attention without impacting sleep. No increased anxiety, palpitations, or chest pain reported. He takes medication during weekdays and skips on weekends, which is acceptable. - Continue current ADHD medication regimen with morning and afternoon doses. - Provide a three-month prescription for Adderall. - Instruct him to contact for a refill when the current supply is depleted. - Schedule follow-up in six months to reassess ADHD management.

## 2023-09-05 ENCOUNTER — Telehealth: Admitting: Nurse Practitioner

## 2023-09-14 ENCOUNTER — Encounter: Payer: Self-pay | Admitting: Nurse Practitioner

## 2023-09-15 ENCOUNTER — Other Ambulatory Visit: Payer: Self-pay | Admitting: Nurse Practitioner

## 2023-09-15 DIAGNOSIS — F900 Attention-deficit hyperactivity disorder, predominantly inattentive type: Secondary | ICD-10-CM

## 2023-09-15 MED ORDER — AMPHETAMINE-DEXTROAMPHETAMINE 10 MG PO TABS: 10.0000 mg | ORAL_TABLET | Freq: Two times a day (BID) | ORAL | 0 refills | Status: DC

## 2023-10-11 MED ORDER — AMPHETAMINE-DEXTROAMPHETAMINE 10 MG PO TABS: 10.0000 mg | ORAL_TABLET | Freq: Two times a day (BID) | ORAL | 0 refills | Status: AC

## 2023-10-11 NOTE — Addendum Note (Signed)
 Addended by: Murray Guzzetta, CAMIE E on: 10/11/2023 09:17 AM   Modules accepted: Orders

## 2023-10-12 ENCOUNTER — Other Ambulatory Visit (INDEPENDENT_AMBULATORY_CARE_PROVIDER_SITE_OTHER)

## 2023-10-12 DIAGNOSIS — Z23 Encounter for immunization: Secondary | ICD-10-CM | POA: Diagnosis not present

## 2023-10-16 ENCOUNTER — Encounter: Payer: Self-pay | Admitting: Family Medicine

## 2023-10-16 ENCOUNTER — Ambulatory Visit: Admitting: Family Medicine

## 2023-10-16 VITALS — BP 132/86 | HR 78 | Temp 98.0°F | Ht 70.0 in | Wt 236.0 lb

## 2023-10-16 DIAGNOSIS — E66811 Obesity, class 1: Secondary | ICD-10-CM | POA: Diagnosis not present

## 2023-10-16 DIAGNOSIS — Z6833 Body mass index (BMI) 33.0-33.9, adult: Secondary | ICD-10-CM | POA: Diagnosis not present

## 2023-10-16 DIAGNOSIS — G4733 Obstructive sleep apnea (adult) (pediatric): Secondary | ICD-10-CM | POA: Diagnosis not present

## 2023-10-16 DIAGNOSIS — E6609 Other obesity due to excess calories: Secondary | ICD-10-CM

## 2023-10-16 DIAGNOSIS — I10 Essential (primary) hypertension: Secondary | ICD-10-CM

## 2023-10-16 DIAGNOSIS — Z9884 Bariatric surgery status: Secondary | ICD-10-CM | POA: Insufficient documentation

## 2023-10-16 NOTE — Progress Notes (Signed)
 Office: [phone]  /  Fax: [phone]   Initial Visit  Kurt Gomez was seen in clinic today to evaluate for obesity. He is interested in losing weight to improve overall health and reduce the risk of weight related complications. He presents today to review program treatment options, initial physical assessment, and evaluation.     He was referred by: Friend or Family  When asked what else they would like to accomplish? He states: Adopt a healthier eating pattern and lifestyle, Improve energy levels and physical activity, Improve existing medical conditions, Improve quality of life, and Improve appearance.  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    When asked how has your weight affected you? He states: Contributed to medical problems  Some associated conditions: Hypertension and OSA  Contributing factors: family history of obesity and moderate to high levels of stress  Weight promoting medications identified: None  Current nutrition plan: Low-carb  Current level of physical activity: Other: started C25K  Current or previous pharmacotherapy: Phentermine ; did one dose of ? Mounjaro  and was sick  Response to medication: Ineffective so it was discontinued   Past medical history includes:   Past Medical History:  Diagnosis Date   Anxiety and depression    At risk for sleep apnea    Elevated liver function tests    Family history of adverse reaction to anesthesia    mother PONV   GERD (gastroesophageal reflux disease)    HTN (hypertension)    Hyperlipidemia    Morbid obesity (HCC)    Pre-diabetes    Sleep apnea    Uncontrolled hypertension 11/14/2019     Objective:   BP 132/86   Pulse 78   Temp 98 F (36.7 C)   Ht 5' 10 (1.778 m)   Wt 236 lb (107 kg)   SpO2 98%   BMI 33.86 kg/m  He was weighed on the bioimpedance scale: Body mass index  is 33.86 kg/m.  Peak Weight:360 , Body Fat%:27.8, Visceral Fat Rating:12, Weight trend over the last 12 months: Unchanged  General:  Alert, oriented and cooperative. Patient is in no acute distress.  Respiratory: Normal respiratory effort, no problems with respiration noted   Gait: able to ambulate independently  Mental Status: Normal mood and affect. Normal behavior. Normal judgment and thought content.   DIAGNOSTIC DATA REVIEWED:  BMET    Component Value Date/Time   NA 142 04/10/2023 1449   K 4.3 04/10/2023 1449   CL 102 04/10/2023 1449   CO2 26 04/10/2023 1449   GLUCOSE 88 04/10/2023 1449   GLUCOSE 109 (H) 04/06/2021 0419   BUN 14 04/10/2023 1449   CREATININE 1.05 04/10/2023 1449   CALCIUM 10.1 04/10/2023 1449   GFRNONAA >60 04/06/2021 0419   GFRAA 109 12/19/2019 0941   Lab Results  Component Value Date   HGBA1C 5.2 04/10/2023   HGBA1C 5.7 (H) 10/15/2019   No results found for: INSULIN CBC    Component Value Date/Time   WBC 5.3 04/10/2023 1449   WBC 9.9 04/06/2021 0419   RBC 5.62 04/10/2023 1449   RBC 4.64 04/06/2021 0419   HGB 16.1 04/10/2023 1449   HCT 47.7 04/10/2023 1449   PLT 365 04/10/2023 1449   MCV 85 04/10/2023 1449   MCH 28.6 04/10/2023 1449   MCH 26.9 04/06/2021 0419   MCHC 33.8 04/10/2023 1449  MCHC 31.4 04/06/2021 0419   RDW 13.2 04/10/2023 1449   Iron/TIBC/Ferritin/ %Sat No results found for: IRON, TIBC, FERRITIN, IRONPCTSAT Lipid Panel     Component Value Date/Time   CHOL 239 (H) 04/10/2023 1449   TRIG 96 04/10/2023 1449   HDL 55 04/10/2023 1449   CHOLHDL 4.3 04/10/2023 1449   LDLCALC 167 (H) 04/10/2023 1449   Hepatic Function Panel     Component Value Date/Time   PROT 7.5 04/10/2023 1449   ALBUMIN 4.8 04/10/2023 1449   AST 22 04/10/2023 1449   ALT 30 04/10/2023 1449   ALKPHOS 115 04/10/2023 1449   BILITOT 0.3 04/10/2023 1449   BILIDIR <0.10 08/31/2021 0723      Component Value Date/Time   TSH 2.400 10/15/2019 1555      Assessment and Plan:   Primary hypertension Doing well on Norvasc  10 mg daily and Coreg  6.25 mg bid.   Continue current meds.  Look for improvements with weight reduction  Class 1 obesity due to excess calories with serious comorbidity and body mass index (BMI) of 33.0 to 33.9 in adult  OSA on CPAP No longer on CPAP but had OSA prior to VSG Denies daytime somnolence. Consider repeat PSG  S/P laparoscopic sleeve gastrectomy Has volume restriction at mealtime Working on lean protein intake with meals Has room to ramp up exercise esp weight training Update labs next visit     Obesity Treatment / Action Plan:  Patient will work on garnering support from family and friends to begin weight loss journey. Will work on eliminating or reducing the presence of highly palatable, calorie dense foods in the home. Will complete provided nutritional and psychosocial assessment questionnaire before the next appointment. Will be scheduled for indirect calorimetry to determine resting energy expenditure in a fasting state.  This will allow us  to create a reduced calorie, high-protein meal plan to promote loss of fat mass while preserving muscle mass. Will think about ideas on how to incorporate physical activity into their daily routine.  Obesity Education Performed Today:  He was weighed on the bioimpedance scale and results were discussed and documented in the synopsis.  We discussed obesity as a disease and the importance of a more detailed evaluation of all the factors contributing to the disease.  We discussed the importance of long term lifestyle changes which include nutrition, exercise and behavioral modifications as well as the importance of customizing this to his specific health and social needs.  We discussed the benefits of reaching a healthier weight to alleviate the symptoms of existing conditions and reduce the risks of the biomechanical, metabolic and psychological effects  of obesity.  Kurt Gomez appears to be in the action stage of change and states they are ready to start intensive lifestyle modifications and behavioral modifications.  21 minutes was spent today on this visit including the above counseling, pre-visit chart review, and post-visit documentation.  Reviewed by clinician on day of visit: allergies, medications, problem list, medical history, surgical history, family history, social history, and previous encounter notes pertinent to obesity diagnosis.    Darice Haddock, D.O. DABFM, [facility] [address] [place], [place] [address] [phone]

## 2023-11-01 ENCOUNTER — Encounter (HOSPITAL_COMMUNITY): Payer: Self-pay | Admitting: *Deleted

## 2023-11-16 ENCOUNTER — Encounter: Payer: Self-pay | Admitting: Family Medicine

## 2023-11-16 ENCOUNTER — Ambulatory Visit (INDEPENDENT_AMBULATORY_CARE_PROVIDER_SITE_OTHER): Admitting: Family Medicine

## 2023-11-16 VITALS — BP 132/81 | HR 64 | Temp 98.0°F | Ht 70.0 in | Wt 232.0 lb

## 2023-11-16 DIAGNOSIS — I10 Essential (primary) hypertension: Secondary | ICD-10-CM | POA: Diagnosis not present

## 2023-11-16 DIAGNOSIS — K912 Postsurgical malabsorption, not elsewhere classified: Secondary | ICD-10-CM

## 2023-11-16 DIAGNOSIS — Z6833 Body mass index (BMI) 33.0-33.9, adult: Secondary | ICD-10-CM

## 2023-11-16 DIAGNOSIS — Z1331 Encounter for screening for depression: Secondary | ICD-10-CM

## 2023-11-16 DIAGNOSIS — R0602 Shortness of breath: Secondary | ICD-10-CM

## 2023-11-16 DIAGNOSIS — E66811 Obesity, class 1: Secondary | ICD-10-CM

## 2023-11-16 DIAGNOSIS — Z9884 Bariatric surgery status: Secondary | ICD-10-CM

## 2023-11-16 DIAGNOSIS — R5383 Other fatigue: Secondary | ICD-10-CM

## 2023-11-16 DIAGNOSIS — E785 Hyperlipidemia, unspecified: Secondary | ICD-10-CM

## 2023-11-16 DIAGNOSIS — E782 Mixed hyperlipidemia: Secondary | ICD-10-CM

## 2023-11-16 NOTE — Progress Notes (Signed)
 At a Glance:  Vitals Temp: 98 F (36.7 C) BP: 132/81 Pulse Rate: 64 SpO2: 98 %   Anthropometric Measurements Height: 5' 10 (1.778 m) Weight: 232 lb (105.2 kg) BMI (Calculated): 33.29 Starting Weight: 232lb Peak Weight: 360lb Waist Measurement : 45 inches   Body Composition  Body Fat %: 27.2 % Fat Mass (lbs): 63.4 lbs Muscle Mass (lbs): 161 lbs Total Body Water  (lbs): 115.2 lbs Visceral Fat Rating : 12   Other Clinical Data RMR: 2059 Fasting: Yes Labs: Yes Today's Visit #: 1 Starting Date: 11/16/23    EKG: Normal sinus rhythm, rate 69.  Indirect Calorimeter completed today shows a VO2 of 298 and a REE of 2059.  His calculated basal metabolic rate is 7699 thus his basal metabolic rate is worse than expected.  Chief Complaint:  Obesity   Subjective:  Kurt Gomez (MR# [id number]) is a 38 y.o. male who presents for evaluation and treatment of obesity and related comorbidities.   Kurt Gomez is currently in the action stage of change and ready to dedicate time achieving and maintaining a healthier weight. Kurt Gomez is interested in becoming our patient and working on intensive lifestyle modifications including (but not limited to) diet and exercise for weight loss.  Kurt Gomez has been struggling with his weight. He has been unsuccessful in either losing weight, maintaining weight loss, or reaching his healthy weight goal. He has been overweight since childhood.  He gained more weight in grad school.  He has had a VSG done with Dr Signe March 2023 down from a max weight of 340 lb and is at his nadir weight now.   Kurt Gomez's habits were reviewed today and are as follows: His family eats meals together, his desired weight loss is 30+ lbs, he has been heavy most of his life, he snacks frequently in the evenings, he has problems with excessive hunger, and he struggles with emotional eating. 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  Other  Fatigue Kurt Gomez denies daytime somnolence and denies waking up still tired. Patient has a history of symptoms of hypertension. Kurt Gomez generally gets 7 or 8 hours of sleep per night, and states that he has generally restful sleep. Snoring is not present. Apneic episodes are not present. Epworth Sleepiness Score is 6. Previously had OSA before WLS.  Shortness of Breath Kurt Gomez notes increasing shortness of breath with exercising and seems to be worsening over time with weight gain. He notes getting out of breath sooner with activity than he used to. This has gotten worse recently. Kurt Gomez denies shortness of breath at rest or orthopnea.   Depression Screen Kurt Gomez Food and Mood (modified PHQ-9) score was 6.     11/16/2023    7:46 AM  Depression screen PHQ 2/9  Decreased Interest 0  Down, Depressed, Hopeless 0  PHQ - 2 Score 0  Altered sleeping 0  Tired, decreased energy 1  Change in appetite 0  Feeling bad or failure about yourself  0  Trouble concentrating 0  Moving slowly or fidgety/restless 0  Suicidal thoughts 0  PHQ-9 Score 1  Difficult doing work/chores Not difficult at all     Assessment and Plan:   Other Fatigue Kurt Gomez does feel that his weight is causing his energy to be lower than it should be. Fatigue may be related to obesity, depression or many other causes. Labs will be ordered, and in the meanwhile, Kurt Gomez will focus on self care including making healthy food choices,  increasing physical activity and focusing on stress reduction.  Shortness of Breath Kurt Gomez does not feel that he gets out of breath more easily that he used to when he exercises. Kurt Gomez's shortness of breath appears to be obesity related and exercise induced. He has agreed to work on weight loss and gradually increase exercise to treat his exercise induced shortness of breath. Will continue to monitor closely.    Problem List Items Addressed This Visit     Hyperlipidemia Lab Results  Component Value  Date   CHOL 239 (H) 04/10/2023   HDL 55 04/10/2023   LDLCALC 167 (H) 04/10/2023   TRIG 96 04/10/2023   CHOLHDL 4.3 04/10/2023   He is not on any lipid lowering medication Begin prescribed diet, low in saturated fat Repeat lab today   Relevant Orders   Lipid panel   Primary hypertension BP is controlled on amlodopine 10 mg daily, carvedilol  6.25 mg bid daily Look for further BP improvements with weight reduction    S/P laparoscopic sleeve gastrectomy Doing well with continued weight loss > 2 years from surgery w/o regain Reminded him to take a Bariatric MVI daily and will refocus on lean protein intake with meals. Continue weight training + cardio 3 days/ wk Check labs today   Other Visit Diagnoses       SOB (shortness of breath)    -  Primary     Postoperative intestinal malabsorption       Relevant Orders   VITAMIN D 25 Hydroxy (Vit-D Deficiency, Fractures)   Prealbumin     Other fatigue       Relevant Orders   Comprehensive metabolic panel with GFR   Vitamin B12   CBC   TSH Rfx on Abnormal to Free T4   Hemoglobin A1c   Insulin, random   Folate   Ferritin   Iron and TIBC   Vitamin B1     Obesity, Class I, BMI 30-34.9       Relevant Orders   EKG 12-Lead (Completed)     BMI 33.0-33.9,adult           Kurt Gomez is currently in the action stage of change and his goal is to continue with weight loss efforts. I recommend Kurt Gomez begin the structured treatment plan as follows:  He has agreed to Category 3 Plan + 200 additional snack calories ((1700 cal))  Exercise goals: For substantial health benefits, adults should do at least 150 minutes (2 hours and 30 minutes) a week of moderate-intensity, or 75 minutes (1 hour and 15 minutes) a week of vigorous-intensity aerobic physical activity, or an equivalent combination of moderate- and vigorous-intensity aerobic activity. Aerobic activity should be performed in episodes of at least 10 minutes, and preferably, it should be spread  throughout the week.  Behavioral modification strategies:increasing lean protein intake, increase H2O intake, increase high fiber foods, decreasing eating out, no skipping meals, meal planning and cooking strategies, keeping healthy foods in the home, better snacking choices, and planning for success  He was informed of the importance of frequent follow-up visits to maximize his success with intensive lifestyle modifications for his multiple health conditions. He was informed we would discuss his lab results at his next visit unless there is a critical issue that needs to be addressed sooner. Srihaan agreed to keep his next visit at the agreed upon time to discuss these results.  Objective:  General: Cooperative, alert, well developed, in no acute distress. HEENT: Conjunctivae and lids unremarkable. Cardiovascular: Regular rhythm.  Lungs: Normal work of breathing. Neurologic: No focal deficits.   Lab Results  Component Value Date   CREATININE 1.05 04/10/2023   BUN 14 04/10/2023   NA 142 04/10/2023   K 4.3 04/10/2023   CL 102 04/10/2023   CO2 26 04/10/2023   Lab Results  Component Value Date   ALT 30 04/10/2023   AST 22 04/10/2023   ALKPHOS 115 04/10/2023   BILITOT 0.3 04/10/2023   Lab Results  Component Value Date   HGBA1C 5.2 04/10/2023   HGBA1C 5.2 03/31/2021   HGBA1C 6.0 (H) 09/09/2020   HGBA1C 5.7 (H) 10/15/2019   No results found for: INSULIN Lab Results  Component Value Date   TSH 2.400 10/15/2019   Lab Results  Component Value Date   CHOL 239 (H) 04/10/2023   HDL 55 04/10/2023   LDLCALC 167 (H) 04/10/2023   TRIG 96 04/10/2023   CHOLHDL 4.3 04/10/2023   Lab Results  Component Value Date   WBC 5.3 04/10/2023   HGB 16.1 04/10/2023   HCT 47.7 04/10/2023   MCV 85 04/10/2023   PLT 365 04/10/2023   No results found for: IRON, TIBC, FERRITIN  Attestation Statements:  Reviewed by clinician on day of visit: allergies, medications, problem list,  medical history, surgical history, family history, social history, and previous encounter notes.  Time spent on visit including pre-visit chart review and post-visit charting and face- to face care including nutritional counseling, review of EKG, interpretation of body composition scale and indirect calorimetry and nutrition prescription  was 40 minutes.   Darice Haddock, D.O. DABFM, DABOM [facility] [address] [place], [place] [address] [phone]

## 2023-11-21 ENCOUNTER — Ambulatory Visit: Payer: Self-pay | Admitting: Family Medicine

## 2023-11-21 LAB — VITAMIN D 25 HYDROXY (VIT D DEFICIENCY, FRACTURES): Vit D, 25-Hydroxy: 32 ng/mL (ref 30.0–100.0)

## 2023-11-21 LAB — IRON AND TIBC
Iron Saturation: 24 % (ref 15–55)
Iron: 70 ug/dL (ref 38–169)
Total Iron Binding Capacity: 287 ug/dL (ref 250–450)
UIBC: 217 ug/dL (ref 111–343)

## 2023-11-21 LAB — COMPREHENSIVE METABOLIC PANEL WITH GFR
ALT: 21 IU/L (ref 0–44)
AST: 15 IU/L (ref 0–40)
Albumin: 4.6 g/dL (ref 4.1–5.1)
Alkaline Phosphatase: 116 IU/L (ref 47–123)
BUN/Creatinine Ratio: 17 (ref 9–20)
BUN: 19 mg/dL (ref 6–20)
Bilirubin Total: 0.3 mg/dL (ref 0.0–1.2)
CO2: 23 mmol/L (ref 20–29)
Calcium: 10.1 mg/dL (ref 8.7–10.2)
Chloride: 102 mmol/L (ref 96–106)
Creatinine, Ser: 1.11 mg/dL (ref 0.76–1.27)
Globulin, Total: 2.8 g/dL (ref 1.5–4.5)
Glucose: 85 mg/dL (ref 70–99)
Potassium: 4.5 mmol/L (ref 3.5–5.2)
Sodium: 143 mmol/L (ref 134–144)
Total Protein: 7.4 g/dL (ref 6.0–8.5)
eGFR: 89 mL/min/1.73 (ref >59)

## 2023-11-21 LAB — FOLATE: Folate: >20 ng/mL (ref >3.0)

## 2023-11-21 LAB — TSH RFX ON ABNORMAL TO FREE T4: TSH: 1.93 u[IU]/mL (ref 0.450–4.500)

## 2023-11-21 LAB — CBC
Hematocrit: 46.1 % (ref 37.5–51.0)
Hemoglobin: 15.3 g/dL (ref 13.0–17.7)
MCH: 29.2 pg (ref 26.6–33.0)
MCHC: 33.2 g/dL (ref 31.5–35.7)
MCV: 88 fL (ref 79–97)
Platelets: 349 x10E3/uL (ref 150–450)
RBC: 5.24 x10E6/uL (ref 4.14–5.80)
RDW: 13 % (ref 11.6–15.4)
WBC: 5.4 x10E3/uL (ref 3.4–10.8)

## 2023-11-21 LAB — LIPID PANEL
Chol/HDL Ratio: 5.2 ratio — ABNORMAL HIGH (ref 0.0–5.0)
Cholesterol, Total: 282 mg/dL — ABNORMAL HIGH (ref 100–199)
HDL: 54 mg/dL (ref >39)
LDL Chol Calc (NIH): 215 mg/dL — ABNORMAL HIGH (ref 0–99)
Triglycerides: 78 mg/dL (ref 0–149)
VLDL Cholesterol Cal: 13 mg/dL (ref 5–40)

## 2023-11-21 LAB — HEMOGLOBIN A1C
Est. average glucose Bld gHb Est-mCnc: 103 mg/dL
Hgb A1c MFr Bld: 5.2 % (ref 4.8–5.6)

## 2023-11-21 LAB — INSULIN, RANDOM: INSULIN: 8 u[IU]/mL (ref 2.6–24.9)

## 2023-11-21 LAB — FERRITIN: Ferritin: 261 ng/mL (ref 30–400)

## 2023-11-21 LAB — VITAMIN B1: Thiamine: 182.5 nmol/L (ref 66.5–200.0)

## 2023-11-21 LAB — PREALBUMIN: PREALBUMIN: 29 mg/dL (ref 14–35)

## 2023-11-21 LAB — VITAMIN B12: Vitamin B-12: 1405 pg/mL — ABNORMAL HIGH (ref 232–1245)

## 2023-11-30 ENCOUNTER — Encounter: Payer: Self-pay | Admitting: Family Medicine

## 2023-11-30 ENCOUNTER — Ambulatory Visit (INDEPENDENT_AMBULATORY_CARE_PROVIDER_SITE_OTHER): Admitting: Family Medicine

## 2023-11-30 VITALS — BP 126/86 | HR 78 | Temp 98.6°F | Ht 70.0 in | Wt 230.0 lb

## 2023-11-30 DIAGNOSIS — E66811 Obesity, class 1: Secondary | ICD-10-CM

## 2023-11-30 DIAGNOSIS — K912 Postsurgical malabsorption, not elsewhere classified: Secondary | ICD-10-CM

## 2023-11-30 DIAGNOSIS — Z9884 Bariatric surgery status: Secondary | ICD-10-CM

## 2023-11-30 DIAGNOSIS — I10 Essential (primary) hypertension: Secondary | ICD-10-CM | POA: Diagnosis not present

## 2023-11-30 DIAGNOSIS — Z6833 Body mass index (BMI) 33.0-33.9, adult: Secondary | ICD-10-CM

## 2023-11-30 DIAGNOSIS — E7800 Pure hypercholesterolemia, unspecified: Secondary | ICD-10-CM | POA: Diagnosis not present

## 2023-11-30 DIAGNOSIS — E6609 Other obesity due to excess calories: Secondary | ICD-10-CM

## 2023-11-30 NOTE — Progress Notes (Signed)
 Office: [phone]  /  Fax: [phone]  WEIGHT SUMMARY AND BIOMETRICS  Starting Date: 11/16/23  Starting Weight: 232lb   Weight Lost Since Last Visit: 2lb   Vitals Temp: 98.6 F (37 C) BP: 126/86 Pulse Rate: 78 SpO2: 97 %   Body Composition  Body Fat %: 27.7 % Fat Mass (lbs): 64 lbs Muscle Mass (lbs): 158.6 lbs Total Body Water  (lbs): 118.8 lbs Visceral Fat Rating : 12    HPI  Chief Complaint: OBESITY  Kurt Gomez is here to discuss his progress with his obesity treatment plan. He is on the the Category 3 Plan and states he is following his eating plan approximately 75 % of the time. He states he is exercising 30-60 minutes 2 times per week.  Interval History:  Since last office visit he is down 2 lb He has been less active the past 2 weeks He is doing C25K 2 x a week He is working on bringing lunch to work and making time to eat He has access to home exercise equipment He plans to add in some weight training He is down from his max weight of 340 pounds prior to vertical sleeve gastrectomy with Dr. Signe March 2023  Pharmacotherapy: None  PHYSICAL EXAM:  Blood pressure 126/86, pulse 78, temperature 98.6 F (37 C), height 5' 10 (1.778 m), weight 230 lb (104.3 kg), SpO2 97%. Body mass index is 33 kg/m.  General: He is overweight, cooperative, alert, well developed, and in no acute distress. PSYCH: Has normal mood, affect and thought process.   Lungs: Normal breathing effort, no conversational dyspnea.   ASSESSMENT AND PLAN  TREATMENT PLAN FOR OBESITY:  Recommended Dietary Goals  Kurt Gomez is currently in the action stage of change. As such, his goal is to continue weight management plan. He has agreed to the Category 3 Plan and keeping a food journal and adhering to recommended goals of 1700 calories and 100 g of protein.  Behavioral Intervention  We discussed the following Behavioral Modification Strategies today: increasing lean protein intake to  established goals, increasing fiber rich foods, avoiding skipping meals, increasing water  intake , work on meal planning and preparation, work on counselling psychologist calories using tracking application, keeping healthy foods at home, avoiding temptations and identifying enticing environmental cues, and planning for success.  Additional resources provided today: NA  Recommended Physical Activity Goals  Kurt Gomez has been advised to work up to 150 minutes of moderate intensity aerobic activity a week and strengthening exercises 2-3 times per week for cardiovascular health, weight loss maintenance and preservation of muscle mass.   He has agreed to [name] strengthening exercises with a goal of 2-3 sessions a week  and Increase the intensity, frequency or duration of aerobic exercises    Pharmacotherapy changes for the treatment of obesity: None  ASSOCIATED CONDITIONS ADDRESSED TODAY  Hypercholesteremia Lab Results  Component Value Date   CHOL 282 (H) 11/16/2023   HDL 54 11/16/2023   LDLCALC 215 (H) 11/16/2023   TRIG 78 11/16/2023   CHOLHDL 5.2 (H) 11/16/2023  Reviewed lab results with patient.  His LDL is continuing to rise despite dietary change and weight loss.  He does have a positive family history for hyperlipidemia and has never been on lipid-lowering medication.  His liver enzymes are now normal.  Continue to work on prescribed dietary plan which is low in saturated fat and talk to PCP about treatment for hyperlipidemia.  Class 1 obesity due to excess calories with serious comorbidity and  body mass index (BMI) of 33.0 to 33.9 in adult Improving Reviewed bioimpedance results  S/P laparoscopic sleeve gastrectomy Continue to work on eating on a schedule incorporating lean protein with meals and snacks, listening to fullness cues, drinking water  between meals and taking a bariatric multivitamin daily. Overall, he is down from his max weight of 340 pounds and is currently at his nadir  weight.  His goal weight is 200 pounds.  Postoperative intestinal malabsorption Reviewed lab results with patient.  His iron level, B1 and B12 level are at goal.  He is taking a bariatric multivitamin daily.  His vitamin D is suboptimal less than 50.  Recommend adding in vitamin D3 1000 IU once daily.  In addition, recommend calcium supplement twice daily. Prealbumin at goal indicating adequate protein intake  Primary hypertension Blood pressure is at goal currently on 2 antihypertensive medications.  Continue     He was informed of the importance of frequent follow up visits to maximize his success with intensive lifestyle modifications for his multiple health conditions.   ATTESTASTION STATEMENTS:  Reviewed by clinician on day of visit: allergies, medications, problem list, medical history, surgical history, family history, social history, and previous encounter notes pertinent to obesity diagnosis.   I have personally spent 32 minutes total time today in preparation, patient care, nutritional counseling and education,  and documentation for this visit, including the following: review of most recent clinical lab tests, prescribing medications/ refilling medications, reviewing medical assistant documentation, review and interpretation of bioimpedence results.     Darice Haddock, D.O. DABFM, DABOM [facility] [address] [place], [place] [address] [phone]

## 2023-11-30 NOTE — Patient Instructions (Addendum)
 Vitamin D 3,000 international units  daily- can take with multivitamin Add 2 Tums daily - separate from Multivitamin   Meal replacement option: Protein shake and a fruit serving  Daily calorie target: 1700 per day Daily protein target: 100 g/ day  Talk to your PCP about your lipid results ( I will send her a copy )  Aim for a good workout (walking/ run intervals) 4-5 days/ wk Add in some weight training from home 2 days/ wk

## 2024-01-02 ENCOUNTER — Encounter: Payer: Self-pay | Admitting: Family Medicine

## 2024-01-02 ENCOUNTER — Ambulatory Visit: Admitting: Family Medicine

## 2024-01-02 VITALS — BP 125/85 | HR 72 | Ht 70.0 in | Wt 228.0 lb

## 2024-01-02 DIAGNOSIS — E66811 Obesity, class 1: Secondary | ICD-10-CM

## 2024-01-02 DIAGNOSIS — Z6832 Body mass index (BMI) 32.0-32.9, adult: Secondary | ICD-10-CM | POA: Diagnosis not present

## 2024-01-02 DIAGNOSIS — I10 Essential (primary) hypertension: Secondary | ICD-10-CM

## 2024-01-02 DIAGNOSIS — Z9884 Bariatric surgery status: Secondary | ICD-10-CM | POA: Diagnosis not present

## 2024-01-02 NOTE — Progress Notes (Signed)
 Office: [phone]  /  Fax: [phone]  WEIGHT SUMMARY AND BIOMETRICS  Starting Date: 11/16/23  Starting Weight: 232lb   Weight Lost Since Last Visit: 2lb   Vitals BP: 125/85 Pulse Rate: 72 SpO2: 98 %   Body Composition  Body Fat %: 27.6 % Fat Mass (lbs): 63 lbs Muscle Mass (lbs): 157 lbs Total Body Water  (lbs): 117.2 lbs Visceral Fat Rating : 12     HPI  Chief Complaint: OBESITY  Kurt Gomez is here to discuss his progress with his obesity treatment plan. He is on the the Category 3 Plan and states he is following his eating plan approximately 85 % of the time. He states he is exercising 30-60 minutes 1-2 times per week.   Interval History:  Since last office visit he is down 2 lb This gives him a net weight loss of 4 lb in 1.5 mos He has been eating more baked goods for snacks He is doing fairly well with lean protein intake and meal planning He has reduced workouts to 1 x a week He had COVID since his last visit He is working on hydration He is currently at his nadir weight post VSG March 2023  Pharmacotherapy: none  PHYSICAL EXAM:  Blood pressure 125/85, pulse 72, height 5' 10 (1.778 m), weight 228 lb (103.4 kg), SpO2 98%. Body mass index is 32.71 kg/m.  General: He is overweight, cooperative, alert, well developed, and in no acute distress. PSYCH: Has normal mood, affect and thought process.   Lungs: Normal breathing effort, no conversational dyspnea.  ASSESSMENT AND PLAN  TREATMENT PLAN FOR OBESITY:  Recommended Dietary Goals  Kurt Gomez is currently in the action stage of change. As such, his goal is to continue weight management plan. He has agreed to the Category 3 Plan. + 200 additional snack calories ((1700 cal/ day))  Behavioral Intervention  We discussed the following Behavioral Modification Strategies today: increasing lean protein intake to established goals, increasing fiber rich foods, increasing water  intake , work on meal planning  and preparation, keeping healthy foods at home, continue to work on implementation of reduced calorie nutritional plan, continue to practice mindfulness when eating, planning for success, and celebration eating strategies.  Additional resources provided today: NA  Recommended Physical Activity Goals  Kurt Gomez has been advised to work up to 150 minutes of moderate intensity aerobic activity a week and strengthening exercises 2-3 times per week for cardiovascular health, weight loss maintenance and preservation of muscle mass.   He has agreed to [name] strengthening exercises with a goal of 2-3 sessions a week  and Increase the intensity, frequency or duration of aerobic exercises    Pharmacotherapy changes for the treatment of obesity: none  ASSOCIATED CONDITIONS ADDRESSED TODAY  S/P laparoscopic sleeve gastrectomy Doing well with improved intake of lean protein at meals Taking a MVI daily, calcium and vitamin D  daily As he starts to feel better post COVID, he may ramp back up workouts to include both cardio and weight training 3+ days/ wk Continuing to see weight loss, at nadir weight  Obesity, Class I, BMI 30-34.9 Improving Reviewed plan of care to work on improving food intake, healing from recent COVID infection, and gradually resume workouts  BMI 32.0-32.9,adult  Primary hypertension BP is well controlled today on Carvedilol  6.25 mg bid and amlodopine 10 mg daily, continue per PCP Watch for rise in BP on Adderall    He was informed of the importance of frequent follow up visits to maximize his success  with intensive lifestyle modifications for his multiple health conditions.   ATTESTASTION STATEMENTS:  Reviewed by clinician on day of visit: allergies, medications, problem list, medical history, surgical history, family history, social history, and previous encounter notes pertinent to obesity diagnosis.   I personally spent a total of 30 minutes in the care of the patient today  including preparing to see the patient, performing a medically appropriate exam/evaluation, counseling and educating, and documenting clinical information in the EHR.     Darice Haddock, D.O. DABFM, DABOM [facility] [address] [place], [place] [address] [phone]

## 2024-01-20 ENCOUNTER — Other Ambulatory Visit: Payer: Self-pay | Admitting: Nurse Practitioner

## 2024-01-20 DIAGNOSIS — Z9109 Other allergy status, other than to drugs and biological substances: Secondary | ICD-10-CM

## 2024-01-30 ENCOUNTER — Ambulatory Visit (INDEPENDENT_AMBULATORY_CARE_PROVIDER_SITE_OTHER): Admitting: Family Medicine

## 2024-01-30 ENCOUNTER — Encounter: Payer: Self-pay | Admitting: Family Medicine

## 2024-01-30 VITALS — BP 141/91 | HR 82 | Temp 98.0°F | Ht 70.0 in | Wt 229.0 lb

## 2024-01-30 DIAGNOSIS — Z9884 Bariatric surgery status: Secondary | ICD-10-CM | POA: Diagnosis not present

## 2024-01-30 DIAGNOSIS — I10 Essential (primary) hypertension: Secondary | ICD-10-CM | POA: Diagnosis not present

## 2024-01-30 DIAGNOSIS — Z6832 Body mass index (BMI) 32.0-32.9, adult: Secondary | ICD-10-CM

## 2024-01-30 DIAGNOSIS — E66811 Obesity, class 1: Secondary | ICD-10-CM

## 2024-01-30 NOTE — Progress Notes (Signed)
 "  Office: [phone]  /  Fax: [phone]  WEIGHT SUMMARY AND BIOMETRICS  Starting Date: 11/16/23  Starting Weight: 232lb   Weight Lost Since Last Visit: 0lb   Vitals Temp: 98 F (36.7 C) BP: (!) 141/91 Pulse Rate: 82 SpO2: 97 %   Body Composition  Body Fat %: 27.7 % Fat Mass (lbs): 63.4 lbs Muscle Mass (lbs): 157.6 lbs Total Body Water  (lbs): 114.8 lbs Visceral Fat Rating : 12     HPI  Chief Complaint: OBESITY  Kurt Gomez is here to discuss his progress with his obesity treatment plan. He is on the the Category 3 Plan and states he is following his eating plan approximately 50 % of the time. He states he is exercising 0 minutes 0 times per week.   Interval History:  Since last office visit he is up 1 lb This gives him a net weight loss of 3 lb in 2 mos of medically supervised weight management He did travel for Christmas and got a bit off track He has access to weights and a treadmill at home His motivation to start has been lacking but he plans to start C25K He has a good amount of volume control from his VSG He is getting in his fruits and veggies He is keeping drinks zero sugar, trying to remember to drink outside of mealtime He has continued to see weight loss 2.5 years post VSG  Pharmacotherapy: none  PHYSICAL EXAM:  Blood pressure (!) 141/91, pulse 82, temperature 98 F (36.7 C), height 5' 10 (1.778 m), weight 229 lb (103.9 kg), SpO2 97%. Body mass index is 32.86 kg/m.  General: He is overweight, cooperative, alert, well developed, and in no acute distress. PSYCH: Has normal mood, affect and thought process.   Lungs: Normal breathing effort, no conversational dyspnea.  ASSESSMENT AND PLAN  TREATMENT PLAN FOR OBESITY:  Recommended Dietary Goals  Kurt Gomez is currently in the action stage of change. As such, his goal is to continue weight management plan. He has agreed to the Category 3 Plan.+ 200 additional snack calories  Behavioral  Intervention  We discussed the following Behavioral Modification Strategies today: increasing lean protein intake to established goals, increasing fiber rich foods, increasing water  intake , work on meal planning and preparation, keeping healthy foods at home, planning for success, celebration eating strategies, and avoid all-or-none thinking.  Additional resources provided today: NA  Recommended Physical Activity Goals  [name] has been advised to work up to 150 minutes of moderate intensity aerobic activity a week and strengthening exercises 2-3 times per week for cardiovascular health, weight loss maintenance and preservation of muscle mass.   He has agreed to [name] strengthening exercises with a goal of 2-3 sessions a week  and Start aerobic activity with a goal of 150 minutes a week at moderate intensity.  Schedule in home gym workouts 30 min 5 days/ wk Make this a priority!  Pharmacotherapy changes for the treatment of obesity: none  ASSOCIATED CONDITIONS ADDRESSED TODAY  Primary hypertension BP is slightly up today, on amlodopine 10 mg daily and carvedilol  6.25 mg bid  F/u with PCP as Adderall can elevate blood pressure and avoid use of Phentermine / Qsymia for obesity management  Obesity, Class I, BMI 30-34.9 Overall, doing well but weight loss has slowed down post VSG 2.5 years ago He has not seen any weight regain which is better than average results Further weight reduction must be achieve by ramping up workouts (cardio and weight training) to 4-5 days/  wk consistently while keeping kcal intake 1700-1800 per day which should include 90+ g of protein daily.  Previous GI intolerant to GLP-1's.  BMI 32.0-32.9,adult  S/P laparoscopic sleeve gastrectomy Remember to drink water  outside of mealtime, stay on MVI daily and vitamin D  3,000 international units  daily for maintenance.       He was informed of the importance of frequent follow up visits to maximize his success with  intensive lifestyle modifications for his multiple health conditions.   ATTESTASTION STATEMENTS:  Reviewed by clinician on day of visit: allergies, medications, problem list, medical history, surgical history, family history, social history, and previous encounter notes pertinent to obesity diagnosis.   I personally spent a total of 23 minutes in the care of the patient today including preparing to see the patient, getting/reviewing separately obtained history, performing a medically appropriate exam/evaluation, counseling and educating, and documenting clinical information in the EHR.    Darice Haddock, D.O. DABFM, DABOM [facility] [address] [place], [place] [address] [phone] "

## 2024-02-23 ENCOUNTER — Other Ambulatory Visit: Payer: Self-pay | Admitting: Nurse Practitioner

## 2024-02-23 DIAGNOSIS — Z9109 Other allergy status, other than to drugs and biological substances: Secondary | ICD-10-CM

## 2024-02-27 ENCOUNTER — Ambulatory Visit: Admitting: Family Medicine

## 2024-03-06 ENCOUNTER — Ambulatory Visit: Admitting: Family Medicine

## 2024-03-06 ENCOUNTER — Encounter: Payer: Self-pay | Admitting: Family Medicine

## 2024-03-06 VITALS — BP 152/84 | HR 85 | Ht 70.0 in | Wt 230.0 lb

## 2024-03-06 DIAGNOSIS — E66811 Obesity, class 1: Secondary | ICD-10-CM

## 2024-03-06 DIAGNOSIS — I10 Essential (primary) hypertension: Secondary | ICD-10-CM

## 2024-03-06 DIAGNOSIS — Z6833 Body mass index (BMI) 33.0-33.9, adult: Secondary | ICD-10-CM

## 2024-03-06 DIAGNOSIS — Z9884 Bariatric surgery status: Secondary | ICD-10-CM

## 2024-03-06 NOTE — Patient Instructions (Signed)
 Aim for 1800- 1900 calories per day  This should include 100-110 g of protein per day  Hydrate well with water  outside of mealtime  Have a goal/ put in your calendar for home workouts 3 x a week  Try [employer] at 11 am and an apple with peanut butter at 2 pm

## 2024-03-06 NOTE — Progress Notes (Signed)
 "  Office: [phone]  /  Fax: [phone]  WEIGHT SUMMARY AND BIOMETRICS  Starting Date: 11/16/23  Starting Weight: 232lb   Weight Lost Since Last Visit: 0lb   Vitals BP: (!) 152/84 Pulse Rate: 85 SpO2: 99 %   Body Composition  Body Fat %: 27.3 % Fat Mass (lbs): 62.8 lbs Muscle Mass (lbs): 159.2 lbs Total Body Water  (lbs): 118.2 lbs Visceral Fat Rating : 12     HPI  Chief Complaint: OBESITY  Kurt Gomez is here to discuss his progress with his obesity treatment plan. He is on the the Category 3 Plan and states he is following his eating plan approximately 85 % of the time. He states he is exercising 0 minutes 0 times per week.   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 coverage for AOM  PHYSICAL EXAM:  Blood pressure (!) 152/84, pulse 85, height 5' 10 (1.778 m), weight 230 lb (104.3 kg), SpO2 99%. Body mass index is 33 kg/m.  General: He is overweight, cooperative, alert, well developed, and in no acute distress. PSYCH: Has normal mood, affect and thought process.   Lungs: Normal breathing effort, no conversational dyspnea.   ASSESSMENT AND PLAN  TREATMENT PLAN FOR OBESITY:  Recommended Dietary Goals  Kurt Gomez is currently in the action stage of change. As such, his goal is to continue weight management plan. He has agreed to keeping a food journal and adhering to recommended goals of 1900 calories and 100 g of   protein. 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  Additional resources provided today: NA  Recommended Physical Activity Goals  Kurt Gomez has been advised to work up to 150 minutes of moderate intensity aerobic activity a week and strengthening exercises 2-3 times per week for cardiovascular health, weight loss maintenance and preservation of muscle mass.   He has agreed to [name] strengthening exercises with a goal of 2-3 sessions a week  and Start aerobic activity with a goal of 150 minutes a week at moderate intensity.   Pharmacotherapy changes for the treatment of obesity: none  ASSOCIATED CONDITIONS ADDRESSED TODAY  Primary hypertension BP is elevated today F/u with PCP for management Continue amlodopine 10 mg daily and coreg  6.25 mg bid daily Adderall can raise BP- f/u with PCP to follow Avoid phentermine / Qsymia  Obesity, Class I, BMI 30-34.9  S/P laparoscopic sleeve gastrectomy Pre op weight [name] 2023 340 lb and nadir weight here at 228 lb with a goal weight 215 lb.  We discussed the importance of ramping up  regular exercise.  Reviewed food choices/ logging/ meal planning.  Consider f/u with bariatric RD  BMI 33.0-33.9,adult      He was informed of the importance of frequent follow up visits to maximize his success with intensive lifestyle modifications for his multiple health conditions.   ATTESTASTION STATEMENTS:  Reviewed by clinician on day of visit: allergies, medications, problem list, medical history, surgical history, family  history, social history, and previous encounter notes pertinent to obesity diagnosis.     Kurt Gomez, D.O. DABFM, DABOM [facility] [address] [place], [place] [address] [phone] "

## 2024-04-24 ENCOUNTER — Ambulatory Visit: Admitting: Family Medicine
